# Patient Record
Sex: Female | Born: 1975
Health system: Southern US, Community
[De-identification: ages and names within clinical notes are randomized; demographics above are authoritative.]

## PROBLEM LIST (undated history)

## (undated) DIAGNOSIS — I493 Ventricular premature depolarization: Secondary | ICD-10-CM

## (undated) DIAGNOSIS — R569 Unspecified convulsions: Secondary | ICD-10-CM

## (undated) DIAGNOSIS — F419 Anxiety disorder, unspecified: Secondary | ICD-10-CM

## (undated) DIAGNOSIS — K219 Gastro-esophageal reflux disease without esophagitis: Secondary | ICD-10-CM

## (undated) HISTORY — DX: Ventricular premature depolarization: I49.3

## (undated) HISTORY — PX: OTHER SURGICAL HISTORY: SHX169

---

## 1999-12-05 ENCOUNTER — Other Ambulatory Visit: Admission: RE | Admit: 1999-12-05 | Discharge: 1999-12-05 | Payer: Self-pay | Admitting: *Deleted

## 2001-01-20 ENCOUNTER — Other Ambulatory Visit: Admission: RE | Admit: 2001-01-20 | Discharge: 2001-01-20 | Payer: Self-pay | Admitting: *Deleted

## 2002-01-22 ENCOUNTER — Other Ambulatory Visit: Admission: RE | Admit: 2002-01-22 | Discharge: 2002-01-22 | Payer: Self-pay | Admitting: *Deleted

## 2003-02-01 ENCOUNTER — Other Ambulatory Visit: Admission: RE | Admit: 2003-02-01 | Discharge: 2003-02-01 | Payer: Self-pay | Admitting: *Deleted

## 2014-01-15 ENCOUNTER — Encounter: Payer: Self-pay | Admitting: Emergency Medicine

## 2014-01-15 ENCOUNTER — Emergency Department
Admission: EM | Admit: 2014-01-15 | Discharge: 2014-01-15 | Disposition: A | Discharge status: Home / Self Care | Payer: 59 | Source: Home / Self Care | Attending: Emergency Medicine | Admitting: Emergency Medicine

## 2014-01-15 DIAGNOSIS — I10 Essential (primary) hypertension: Secondary | ICD-10-CM

## 2014-01-15 DIAGNOSIS — Z0130 Encounter for examination of blood pressure without abnormal findings: Secondary | ICD-10-CM

## 2014-01-15 HISTORY — DX: Unspecified convulsions: R56.9

## 2014-01-15 HISTORY — DX: Gastro-esophageal reflux disease without esophagitis: K21.9

## 2014-01-15 NOTE — ED Notes (Signed)
Received an electrical shock in R arm from touching a prong on a dryer cord.  Has rods in R forearm.  Went to ER and BP on adm. 130/90. And later 140/91. States her BP is normally low.  Today went to [employer] and checked her BP and it was 126/93.

## 2014-01-15 NOTE — ED Provider Notes (Signed)
CSN: [id number]     Arrival date & time 01/15/14  1537 History   First MD Initiated Contact with Patient 01/15/14 1552     Chief Complaint  Patient presents with  . Hypertension   (Consider location/radiation/quality/duration/timing/severity/associated sxs/prior Treatment) Patient is a 38 y.o. female presenting with hypertension. The history is provided by the patient. No language interpreter was used.  Hypertension This is a new problem. The current episode started more than 2 days ago. The problem occurs constantly. The problem has been gradually worsening. Pertinent negatives include no chest pain. Nothing aggravates the symptoms. Nothing relieves the symptoms. She has tried nothing for the symptoms.  Pt had a shock for hair dryer cord.   Pt was noted to have increased BP.   Pt advised to have rechecked  Past Medical History  Diagnosis Date  . Seizures   . GERD (gastroesophageal reflux disease)    Past Surgical History  Procedure Laterality Date  . Right arm    . Right foot     Family History  Problem Relation Age of Onset  . Cancer Father   . COPD Father    History  Substance Use Topics  . Smoking status: Never Smoker   . Smokeless tobacco: Never Used  . Alcohol Use: Yes   OB History   Grav Para Term Preterm Abortions TAB SAB Ect Mult Living                 Review of Systems  Cardiovascular: Negative for chest pain.  All other systems reviewed and are negative.   Allergies  Codeine  Home Medications   Prior to Admission medications   Medication Sig Start Date End Date Taking? Authorizing Provider  lamoTRIgine (LAMICTAL) 100 MG tablet Take 100 mg by mouth daily.   Yes Historical Provider, MD  norethindrone-ethinyl estradiol (JUNEL FE,GILDESS FE,LOESTRIN FE) 1-20 MG-MCG tablet Take 1 tablet by mouth daily.   Yes Historical Provider, MD  pantoprazole (PROTONIX) 20 MG tablet Take 20 mg by mouth daily.   Yes Historical Provider, MD  ranitidine (ZANTAC) 300 MG  tablet Take 300 mg by mouth at bedtime.   Yes Historical Provider, MD   BP 137/87  Pulse 98  Temp(Src) 98.7 F (37.1 C) (Oral)  Ht  (1.676 m)  Wt 233 lb (105.688 kg)  BMI 37.63 kg/m2  SpO2 98% Physical Exam  Nursing note and vitals reviewed. Constitutional: She is oriented to person, place, and time. She appears well-developed and well-nourished.  HENT:  Head: Normocephalic and atraumatic.  Eyes: EOM are normal. Pupils are equal, round, and reactive to light.  Neck: Normal range of motion.  Cardiovascular: Normal rate and normal heart sounds.   Pulmonary/Chest: Effort normal and breath sounds normal.  Abdominal: Soft. She exhibits no distension.  Musculoskeletal: Normal range of motion.  Neurological: She is alert and oriented to person, place, and time.  Skin: Skin is warm.  Psychiatric: She has a normal mood and affect.    ED Course  Procedures (including critical care time) Labs Review Labs Reviewed - No data to display  Imaging Review No results found.   MDM   1. Blood pressure check    Reduce sodium Diet Exercise Monitor blood pressure biweekly and keep documentation for primary Md    Elson Areas, [place] 01/15/14 1732

## 2014-01-15 NOTE — Discharge Instructions (Signed)

## 2014-03-26 ENCOUNTER — Encounter: Payer: Self-pay | Admitting: Emergency Medicine

## 2014-03-26 ENCOUNTER — Emergency Department
Admission: EM | Admit: 2014-03-26 | Discharge: 2014-03-26 | Disposition: A | Discharge status: Home / Self Care | Payer: 59 | Source: Home / Self Care | Attending: Family Medicine | Admitting: Family Medicine

## 2014-03-26 DIAGNOSIS — B9789 Other viral agents as the cause of diseases classified elsewhere: Principal | ICD-10-CM

## 2014-03-26 DIAGNOSIS — J069 Acute upper respiratory infection, unspecified: Secondary | ICD-10-CM

## 2014-03-26 HISTORY — DX: Anxiety disorder, unspecified: F41.9

## 2014-03-26 MED ORDER — BENZONATATE 200 MG PO CAPS: 200.0000 mg | ORAL_CAPSULE | Freq: Every day | ORAL | Status: DC

## 2014-03-26 MED ORDER — AZITHROMYCIN 250 MG PO TABS: ORAL_TABLET | ORAL | Status: DC

## 2014-03-26 NOTE — ED Notes (Signed)
Reports increasing congestion and pain across sinuses over past 2 days; headache. No OTCs this morning.

## 2014-03-26 NOTE — ED Provider Notes (Signed)
CSN: [id number]     Arrival date & time 03/26/14  [id number] History   First MD Initiated Contact with Patient 03/26/14 [phone]     Chief Complaint  Patient presents with  . Facial Pain      HPI Comments: Two days ago patient developed fatigue, hoarseness, headache, sinus congestion and facial pain.  Yesterday she developed a productive cough.  No sore throat or fever.  She has started taking Zyrtec without improvement.  The history is provided by the patient.    Past Medical History  Diagnosis Date  . Seizures   . GERD (gastroesophageal reflux disease)   . Anxiety    Past Surgical History  Procedure Laterality Date  . Right arm    . Right foot     Family History  Problem Relation Age of Onset  . Cancer Father   . COPD Father    History  Substance Use Topics  . Smoking status: Never Smoker   . Smokeless tobacco: Never Used  . Alcohol Use: Yes   OB History    No data available     Review of Systems No sore throat + cough No pleuritic pain No wheezing + nasal congestion ? post-nasal drainage No sinus pain/pressure No itchy/red eyes No earache No hemoptysis No SOB No fever/chills No nausea No vomiting No abdominal pain No diarrhea No urinary symptoms No skin rash + fatigue No myalgias + headache Used OTC meds without relief  Allergies  Codeine  Home Medications   Prior to Admission medications   Medication Sig Start Date End Date Taking? Authorizing Provider  azithromycin (ZITHROMAX Z-PAK) 250 MG tablet Take 2 tabs today; then begin one tab once daily for 4 more days. (Rx void after 04/03/14) 03/26/14   Lattie HawStephen A Mechille Varghese, MD  benzonatate (TESSALON) 200 MG capsule Take 1 capsule (200 mg total) by mouth at bedtime. Take as needed for cough 03/26/14   Lattie HawStephen A Mersades Barbaro, MD  lamoTRIgine (LAMICTAL) 100 MG tablet Take 100 mg by mouth daily.    Historical Provider, MD  norethindrone-ethinyl estradiol (JUNEL FE,GILDESS FE,LOESTRIN FE) 1-20 MG-MCG tablet Take 1 tablet  by mouth daily.    Historical Provider, MD  pantoprazole (PROTONIX) 20 MG tablet Take 20 mg by mouth daily.    Historical Provider, MD  ranitidine (ZANTAC) 300 MG tablet Take 300 mg by mouth at bedtime.    Historical Provider, MD   BP 119/74 mmHg  Pulse 104  Temp(Src) 98.3 F (36.8 C) (Oral)  Resp 16  Ht 5\' 6"  (1.676 m)  Wt 234 lb (106.142 kg)  BMI 37.79 kg/m2  SpO2 98%  LMP 03/19/2014 Physical Exam Nursing notes and Vital Signs reviewed. Appearance:  Patient appears stated age, and in no acute distress.  Patient is obese (BMI 37.8) Eyes:  Pupils are equal, round, and reactive to light and accomodation.  Extraocular movement is intact.  Conjunctivae are not inflamed  Ears:  Canals normal.  Tympanic membranes normal.  Nose:  Mildly congested turbinates.  No sinus tenderness.   Pharynx:  Normal Neck:  Supple.  No adenopathy Lungs:  Clear to auscultation.  Breath sounds are equal.  Heart:  Regular rate and rhythm without murmurs, rubs, or gallops.  Abdomen:  Nontender without masses or hepatosplenomegaly.  Bowel sounds are present.  No CVA or flank tenderness.  Extremities:  No edema.  No calf tenderness Skin:  No rash present.   ED Course  Procedures  none   MDM   1. Viral URI  with cough    There is no evidence of bacterial infection today.   Treat symptomatically for now  Prescription written for Benzonatate (Tessalon) to take at bedtime for night-time cough.  Take plain Mucinex (1200 mg guaifenesin) twice daily for cough and congestion.  Increase fluid intake, rest. May use Afrin nasal spray (or generic oxymetazoline) twice daily for about 5 days.  Also recommend using saline nasal spray several times daily and saline nasal irrigation (AYR is a common brand) Try warm salt water gargles for sore throat.  Stop all antihistamines for now, and other non-prescription cough/cold preparations. Begin Azithromycin if not improving about one week or if persistent fever develops (Given  a prescription to hold, with an expiration date)  Follow-up with family doctor if not improving about10 days.     Lattie HawStephen A Tyshun Tuckerman, MD 03/29/14 [phone]

## 2014-03-26 NOTE — Discharge Instructions (Signed)
Take plain Mucinex (1200 mg guaifenesin) twice daily for cough and congestion.  Increase fluid intake, rest. °May use Afrin nasal spray (or generic oxymetazoline) twice daily for about 5 days.  Also recommend using saline nasal spray several times daily and saline nasal irrigation (AYR is a common brand) °Try warm salt water gargles for sore throat.  °Stop all antihistamines for now, and other non-prescription cough/cold preparations. °Begin Azithromycin if not improving about one week or if persistent fever develops   °Follow-up with family doctor if not improving about10 days.  °

## 2014-04-10 ENCOUNTER — Emergency Department (INDEPENDENT_AMBULATORY_CARE_PROVIDER_SITE_OTHER): Payer: 59

## 2014-04-10 ENCOUNTER — Emergency Department
Admission: EM | Admit: 2014-04-10 | Discharge: 2014-04-10 | Disposition: A | Discharge status: Home / Self Care | Payer: 59 | Source: Home / Self Care | Attending: Family Medicine | Admitting: Family Medicine

## 2014-04-10 DIAGNOSIS — R229 Localized swelling, mass and lump, unspecified: Secondary | ICD-10-CM

## 2014-04-10 DIAGNOSIS — R52 Pain, unspecified: Secondary | ICD-10-CM

## 2014-04-10 DIAGNOSIS — S93402A Sprain of unspecified ligament of left ankle, initial encounter: Secondary | ICD-10-CM

## 2014-04-10 MED ORDER — DICLOFENAC SODIUM 50 MG PO TBEC: 50.0000 mg | DELAYED_RELEASE_TABLET | Freq: Two times a day (BID) | ORAL | Status: DC | PRN

## 2014-04-10 NOTE — ED Provider Notes (Addendum)
Tabitha Hill is a 38 y.o. female who presents to Urgent Care today for left ankle injury. Patient fell onto a hole yesterday afternoon suffering an inversion injury to her ankle. She notes pain and swelling. She has not tried any medications. Pain is worse with activity better with rest. She is having difficulty walking.  No chest pains or palpitations.   Past Medical History  Diagnosis Date  . Seizures   . GERD (gastroesophageal reflux disease)   . Anxiety    Past Surgical History  Procedure Laterality Date  . Right arm    . Right foot     History  Substance Use Topics  . Smoking status: Never Smoker   . Smokeless tobacco: Never Used  . Alcohol Use: Yes   ROS as above Medications: No current facility-administered medications for this encounter.   Current Outpatient Prescriptions  Medication Sig Dispense Refill  . diclofenac (VOLTAREN) 50 MG EC tablet Take 1 tablet (50 mg total) by mouth 2 (two) times daily as needed. 60 tablet 0  . lamoTRIgine (LAMICTAL) 100 MG tablet Take 100 mg by mouth daily.    . norethindrone-ethinyl estradiol (JUNEL FE,GILDESS FE,LOESTRIN FE) 1-20 MG-MCG tablet Take 1 tablet by mouth daily.    . ranitidine (ZANTAC) 300 MG tablet Take 300 mg by mouth at bedtime.    . [DISCONTINUED] pantoprazole (PROTONIX) 20 MG tablet Take 20 mg by mouth daily.     Allergies  Allergen Reactions  . Codeine      Exam:  BP 115/80 mmHg  Pulse 134  Temp(Src) 98.1 F (36.7 C) (Oral)  Wt 230 lb 8 oz (104.554 kg)  SpO2 97%  LMP 03/19/2014 Gen: Well NAD Left ankle: Swollen lateral and medially. Tender. Stable ligaments exam. Motion diminished. Pulses capillary refill sensation intact distally.  No results found for this or any previous visit (from the past 24 hour(s)). Dg Ankle Complete Left  04/10/2014   CLINICAL DATA:  Left ankle pain post stepping in a hole yesterday  EXAM: LEFT ANKLE COMPLETE - 3+ VIEW  COMPARISON:  None.  FINDINGS: Three views of left ankle  submitted. No acute fracture or subluxation. There is soft tissue swelling adjacent to lateral malleolus. Ankle mortise is preserved.  IMPRESSION: No acute fracture or subluxation.  Lateral soft tissue swelling.   Electronically Signed   By: Natasha MeadLiviu  Pop M.D.   On: 04/10/2014 12:26    Assessment and Plan: 38 y.o. female with left ankle sprain. ASO crutches NSAIDs physical therapy follow up with sports medicine. Home exercise program as well.  She additionally has an elevated heart rate. This is due to pain. Follow-up with PCP.  Discussed warning signs or symptoms. Please see discharge instructions. Patient expresses understanding.     Rodolph BongEvan S Corey, MD 04/10/14 1254  Rodolph BongEvan S Corey, MD 04/10/14 1254

## 2014-04-10 NOTE — ED Notes (Signed)
Fell in hole yesterday around 4pm, heard loud pop.  Ankle swollen and increase pain when pressure is applied

## 2014-04-10 NOTE — Discharge Instructions (Signed)
Thank you for coming in today. Attend physical therapy and follow-up with Dr. Karie Schwalbe Take diclofenac twice daily for pain. Follow-up with your primary care doctor regarding your elevated heart rate.  Acute Ankle Sprain with Phase I Rehab An acute ankle sprain is a partial or complete tear in one or more of the ligaments of the ankle due to traumatic injury. The severity of the injury depends on both the number of ligaments sprained and the grade of sprain. There are 3 grades of sprains.   A grade 1 sprain is a mild sprain. There is a slight pull without obvious tearing. There is no loss of strength, and the muscle and ligament are the correct length.  A grade 2 sprain is a moderate sprain. There is tearing of fibers within the substance of the ligament where it connects two bones or two cartilages. The length of the ligament is increased, and there is usually decreased strength.  A grade 3 sprain is a complete rupture of the ligament and is uncommon. In addition to the grade of sprain, there are three types of ankle sprains.  Lateral ankle sprains: This is a sprain of one or more of the three ligaments on the outer side (lateral) of the ankle. These are the most common sprains. Medial ankle sprains: There is one large triangular ligament of the inner side (medial) of the ankle that is susceptible to injury. Medial ankle sprains are less common. Syndesmosis, "high ankle," sprains: The syndesmosis is the ligament that connects the two bones of the lower leg. Syndesmosis sprains usually only occur with very severe ankle sprains. SYMPTOMS  Pain, tenderness, and swelling in the ankle, starting at the side of injury that may progress to the whole ankle and foot with time.  "Pop" or tearing sensation at the time of injury.  Bruising that may spread to the heel.  Impaired ability to walk soon after injury. CAUSES   Acute ankle sprains are caused by trauma placed on the ankle that temporarily forces  or pries the anklebone (talus) out of its normal socket.  Stretching or tearing of the ligaments that normally hold the joint in place (usually due to a twisting injury). RISK INCREASES WITH:  Previous ankle sprain.  Sports in which the foot may land awkwardly (i.e., basketball, volleyball, or soccer) or walking or running on uneven or rough surfaces.  Shoes with inadequate support to prevent sideways motion when stress occurs.  Poor strength and flexibility.  Poor balance skills.  Contact sports. PREVENTION   Warm up and stretch properly before activity.  Maintain physical fitness:  Ankle and leg flexibility, muscle strength, and endurance.  Cardiovascular fitness.  Balance training activities.  Use proper technique and have a coach correct improper technique.  Taping, protective strapping, bracing, or high-top tennis shoes may help prevent injury. Initially, tape is best; however, it loses most of its support function within 10 to 15 minutes.  Wear proper-fitted protective shoes (High-top shoes with taping or bracing is more effective than either alone).  Provide the ankle with support during sports and practice activities for 12 months following injury. PROGNOSIS   If treated properly, ankle sprains can be expected to recover completely; however, the length of recovery depends on the degree of injury.  A grade 1 sprain usually heals enough in 5 to 7 days to allow modified activity and requires an average of 6 weeks to heal completely.  A grade 2 sprain requires 6 to 10 weeks to heal completely.  A grade 3 sprain requires 12 to 16 weeks to heal.  A syndesmosis sprain often takes more than 3 months to heal. RELATED COMPLICATIONS   Frequent recurrence of symptoms may result in a chronic problem. Appropriately addressing the problem the first time decreases the frequency of recurrence and optimizes healing time. Severity of the initial sprain does not predict the  likelihood of later instability.  Injury to other structures (bone, cartilage, or tendon).  A chronically unstable or arthritic ankle joint is a possibility with repeated sprains. TREATMENT Treatment initially involves the use of ice, medication, and compression bandages to help reduce pain and inflammation. Ankle sprains are usually immobilized in a walking cast or boot to allow for healing. Crutches may be recommended to reduce pressure on the injury. After immobilization, strengthening and stretching exercises may be necessary to regain strength and a full range of motion. Surgery is rarely needed to treat ankle sprains. MEDICATION   Nonsteroidal anti-inflammatory medications, such as aspirin and ibuprofen (do not take for the first 3 days after injury or within 7 days before surgery), or other minor pain relievers, such as acetaminophen, are often recommended. Take these as directed by your caregiver. Contact your caregiver immediately if any bleeding, stomach upset, or signs of an allergic reaction occur from these medications.  Ointments applied to the skin may be helpful.  Pain relievers may be prescribed as necessary by your caregiver. Do not take prescription pain medication for longer than 4 to 7 days. Use only as directed and only as much as you need. HEAT AND COLD  Cold treatment (icing) is used to relieve pain and reduce inflammation for acute and chronic cases. Cold should be applied for 10 to 15 minutes every 2 to 3 hours for inflammation and pain and immediately after any activity that aggravates your symptoms. Use ice packs or an ice massage.  Heat treatment may be used before performing stretching and strengthening activities prescribed by your caregiver. Use a heat pack or a warm soak. SEEK IMMEDIATE MEDICAL CARE IF:   Pain, swelling, or bruising worsens despite treatment.  You experience pain, numbness, discoloration, or coldness in the foot or toes.  New, unexplained  symptoms develop (drugs used in treatment may produce side effects.) EXERCISES  PHASE I EXERCISES RANGE OF MOTION (ROM) AND STRETCHING EXERCISES - Ankle Sprain, Acute Phase I, Weeks 1 to 2 These exercises may help you when beginning to restore flexibility in your ankle. You will likely work on these exercises for the 1 to 2 weeks after your injury. Once your physician, physical therapist, or athletic trainer sees adequate progress, he or she will advance your exercises. While completing these exercises, remember:   Restoring tissue flexibility helps normal motion to return to the joints. This allows healthier, less painful movement and activity.  An effective stretch should be held for at least 30 seconds.  A stretch should never be painful. You should only feel a gentle lengthening or release in the stretched tissue. RANGE OF MOTION - Dorsi/Plantar Flexion  While sitting with your right / left knee straight, draw the top of your foot upwards by flexing your ankle. Then reverse the motion, pointing your toes downward.  Hold each position for __________ seconds.  After completing your first set of exercises, repeat this exercise with your knee bent. Repeat __________ times. Complete this exercise __________ times per day.  RANGE OF MOTION - Ankle Alphabet  Imagine your right / left big toe is a pen.  Keeping  your hip and knee still, write out the entire alphabet with your "pen." Make the letters as large as you can without increasing any discomfort. Repeat __________ times. Complete this exercise __________ times per day.  STRENGTHENING EXERCISES - Ankle Sprain, Acute -Phase I, Weeks 1 to 2 These exercises may help you when beginning to restore strength in your ankle. You will likely work on these exercises for 1 to 2 weeks after your injury. Once your physician, physical therapist, or athletic trainer sees adequate progress, he or she will advance your exercises. While completing these  exercises, remember:   Muscles can gain both the endurance and the strength needed for everyday activities through controlled exercises.  Complete these exercises as instructed by your physician, physical therapist, or athletic trainer. Progress the resistance and repetitions only as guided.  You may experience muscle soreness or fatigue, but the pain or discomfort you are trying to eliminate should never worsen during these exercises. If this pain does worsen, stop and make certain you are following the directions exactly. If the pain is still present after adjustments, discontinue the exercise until you can discuss the trouble with your clinician. STRENGTH - Dorsiflexors  Secure a rubber exercise band/tubing to a fixed object (i.e., table, pole) and loop the other end around your right / left foot.  Sit on the floor facing the fixed object. The band/tubing should be slightly tense when your foot is relaxed.  Slowly draw your foot back toward you using your ankle and toes.  Hold this position for __________ seconds. Slowly release the tension in the band and return your foot to the starting position. Repeat __________ times. Complete this exercise __________ times per day.  STRENGTH - Plantar-flexors   Sit with your right / left leg extended. Holding onto both ends of a rubber exercise band/tubing, loop it around the ball of your foot. Keep a slight tension in the band.  Slowly push your toes away from you, pointing them downward.  Hold this position for __________ seconds. Return slowly, controlling the tension in the band/tubing. Repeat __________ times. Complete this exercise __________ times per day.  STRENGTH - Ankle Eversion  Secure one end of a rubber exercise band/tubing to a fixed object (table, pole). Loop the other end around your foot just before your toes.  Place your fists between your knees. This will focus your strengthening at your ankle.  Drawing the band/tubing  across your opposite foot, slowly, pull your little toe out and up. Make sure the band/tubing is positioned to resist the entire motion.  Hold this position for __________ seconds. Have your muscles resist the band/tubing as it slowly pulls your foot back to the starting position.  Repeat __________ times. Complete this exercise __________ times per day.  STRENGTH - Ankle Inversion  Secure one end of a rubber exercise band/tubing to a fixed object (table, pole). Loop the other end around your foot just before your toes.  Place your fists between your knees. This will focus your strengthening at your ankle.  Slowly, pull your big toe up and in, making sure the band/tubing is positioned to resist the entire motion.  Hold this position for __________ seconds.  Have your muscles resist the band/tubing as it slowly pulls your foot back to the starting position. Repeat __________ times. Complete this exercises __________ times per day.  STRENGTH - Towel Curls  Sit in a chair positioned on a non-carpeted surface.  Place your right / left foot on a towel,  keeping your heel on the floor.  Pull the towel toward your heel by only curling your toes. Keep your heel on the floor.  If instructed by your physician, physical therapist, or athletic trainer, add weight to the end of the towel. Repeat __________ times. Complete this exercise __________ times per day. Document Released: 11/28/2004 Document Revised: 09/13/2013 Document Reviewed: 08/11/2008 [facility] Patient Information 2015 [employer], [place]. This information is not intended to replace advice given to you by your health care provider. Make sure you discuss any questions you have with your health care provider.

## 2014-04-15 ENCOUNTER — Encounter: Payer: Self-pay | Admitting: Sports Medicine

## 2014-04-15 ENCOUNTER — Ambulatory Visit (INDEPENDENT_AMBULATORY_CARE_PROVIDER_SITE_OTHER): Payer: 59 | Admitting: Sports Medicine

## 2014-04-15 VITALS — BP 135/89 | HR 109 | Ht 66.0 in | Wt 232.0 lb

## 2014-04-15 DIAGNOSIS — S93409A Sprain of unspecified ligament of unspecified ankle, initial encounter: Secondary | ICD-10-CM | POA: Insufficient documentation

## 2014-04-15 DIAGNOSIS — S99912A Unspecified injury of left ankle, initial encounter: Secondary | ICD-10-CM

## 2014-04-15 NOTE — Progress Notes (Signed)
   Subjective:    I'm seeing this patient as a consultation for:  Dr. Clementeen GrahamEvan Corey  CC:  Left ankle injury  HPI: This is a very pleasant 38 year old female, proximally one week ago she fell and had an inversion injury of her left ankle, she had immediate swelling, pain, bruising. She was seen in urgent care where x-rays were negative for fracture or talar dome injury. She was placed in an ASO and was referred to me for further evaluation and definitive treatment. Pain is predominantly along the medial ankle and deltoid ligament complex, she does tell if she gets an occasional catch, lock, and pop, the pop results in improvement in her pain.  Past medical history, Surgical history, Family history not pertinant except as noted below, Social history, Allergies, and medications have been entered into the medical record, reviewed, and no changes needed.   Review of Systems: No headache, visual changes, nausea, vomiting, diarrhea, constipation, dizziness, abdominal pain, skin rash, fevers, chills, night sweats, weight loss, swollen lymph nodes, body aches, joint swelling, muscle aches, chest pain, shortness of breath, mood changes, visual or auditory hallucinations.   Objective:   General: Well Developed, well nourished, and in no acute distress.  Neuro/Psych: Alert and oriented x3, extra-ocular muscles intact, able to move all 4 extremities, sensation grossly intact. Skin: Warm and dry, no rashes noted.  Respiratory: Not using accessory muscles, speaking in full sentences, trachea midline.  Cardiovascular: Pulses palpable, no extremity edema. Abdomen: Does not appear distended. Left Ankle: Visibly swollen and bruised with a palpable fluid wave at the talocrural joint. Range of motion is full in all directions. Application of varus stress/forced inversion of the ankle results in significant pain localized to the medial talocrural joint. Strength is 5/5 in all directions. Stable lateral and medial  ligaments; squeeze test,  Talar dome nontender; No pain at base of 5th MT; No tenderness over cuboid; No tenderness over N spot or navicular prominence No tenderness on posterior aspects of lateral and medial malleolus No sign of peroneal tendon subluxations; Negative tarsal tunnel tinel's Able to walk 4 steps.  Strap with compressive dressing, CAM boot applied.  Impression and Recommendations:   This case required medical decision making of moderate complexity.

## 2014-04-15 NOTE — Assessment & Plan Note (Signed)
Symptoms are suggestive of the deltoid ligament complex sprain. Considering her mechanical symptoms and moderate to severe pain at the talocrural joint I'm also worried about intra-articular loose body. Strap with compressive dressing, cam boot for a month, if no better we will obtain an MRI.

## 2014-05-16 ENCOUNTER — Encounter: Payer: Self-pay | Admitting: Sports Medicine

## 2014-05-16 ENCOUNTER — Ambulatory Visit (INDEPENDENT_AMBULATORY_CARE_PROVIDER_SITE_OTHER): Payer: 59 | Admitting: Sports Medicine

## 2014-05-16 VITALS — BP 135/89 | HR 102 | Ht 66.0 in | Wt 229.0 lb

## 2014-05-16 DIAGNOSIS — S99912D Unspecified injury of left ankle, subsequent encounter: Secondary | ICD-10-CM

## 2014-05-16 NOTE — Progress Notes (Signed)
  Subjective:    CC: recheck left ankle  HPI: One month ago this pleasant 39 year old female had a fairly severe deltoid ligament complex sprain. She also had some mechanical symptoms. She has been in a boot for the past month and returns essentially pain-free, only mild residual pain at the anterior talocrural joint, no further mechanical symptoms.  Past medical history, Surgical history, Family history not pertinant except as noted below, Social history, Allergies, and medications have been entered into the medical record, reviewed, and no changes needed.   Review of Systems: No fevers, chills, night sweats, weight loss, chest pain, or shortness of breath.   Objective:    General: Well Developed, well nourished, and in no acute distress.  Neuro: Alert and oriented x3, extra-ocular muscles intact, sensation grossly intact.  HEENT: Normocephalic, atraumatic, pupils equal round reactive to light, neck supple, no masses, no lymphadenopathy, thyroid nonpalpable.  Skin: Warm and dry, no rashes. Cardiac: Regular rate and rhythm, no murmurs rubs or gallops, no lower extremity edema.  Respiratory: Clear to auscultation bilaterally. Not using accessory muscles, speaking in full sentences. Left Ankle: No visible erythema or swelling. Range of motion is full in all directions. Strength is 5/5 in all directions. Stable lateral and medial ligaments; squeeze test and kleiger test unremarkable; Talar dome nontender;only minimally tender over the anterior talocrural joint. No pain at base of 5th MT; No tenderness over cuboid; No tenderness over N spot or navicular prominence No tenderness on posterior aspects of lateral and medial malleolus No sign of peroneal tendon subluxations; Negative tarsal tunnel tinel's Able to walk 4 steps.  Impression and Recommendations:

## 2014-05-16 NOTE — Assessment & Plan Note (Signed)
Doing much better. She does have some pain referable to the talocrural joint. Swelling has resolved and ankle is stable. I would like her to do some home rehabilitation exercises for the next month, continue regular shoe and weight-bearing as tolerated. If no better in one month we should certainly consider MRI.

## 2014-06-16 ENCOUNTER — Ambulatory Visit: Payer: 59 | Admitting: Sports Medicine

## 2014-06-16 DIAGNOSIS — Z0289 Encounter for other administrative examinations: Secondary | ICD-10-CM

## 2014-10-14 ENCOUNTER — Emergency Department
Admission: EM | Admit: 2014-10-14 | Discharge: 2014-10-14 | Disposition: A | Discharge status: Home / Self Care | Payer: 59 | Source: Home / Self Care | Attending: Emergency Medicine | Admitting: Emergency Medicine

## 2014-10-14 ENCOUNTER — Encounter: Payer: Self-pay | Admitting: Emergency Medicine

## 2014-10-14 DIAGNOSIS — J029 Acute pharyngitis, unspecified: Secondary | ICD-10-CM | POA: Diagnosis not present

## 2014-10-14 MED ORDER — AMOXICILLIN 500 MG PO CAPS: 500.0000 mg | ORAL_CAPSULE | Freq: Three times a day (TID) | ORAL | Status: DC

## 2014-10-14 NOTE — Discharge Instructions (Signed)

## 2014-10-14 NOTE — ED Notes (Signed)
Reports onset of sore throat 2 days ago; yesterday body aches and fever.

## 2014-10-14 NOTE — ED Provider Notes (Signed)
CSN: [id number]     Arrival date & time 10/14/14  [id number] History   First MD Initiated Contact with Patient 10/14/14 0840     Chief Complaint  Patient presents with  . Generalized Body Aches  . Sore Throat   (Consider location/radiation/quality/duration/timing/severity/associated sxs/prior Treatment) Patient is a 39 y.o. female presenting with pharyngitis. The history is provided by the patient. No language interpreter was used.  Sore Throat This is a new problem. The current episode started 2 days ago. The problem occurs constantly. The problem has been gradually worsening. The symptoms are aggravated by drinking. Nothing relieves the symptoms. The treatment provided no relief.    Past Medical History  Diagnosis Date  . Seizures   . GERD (gastroesophageal reflux disease)   . Anxiety    Past Surgical History  Procedure Laterality Date  . Right arm    . Right foot     Family History  Problem Relation Age of Onset  . Cancer Father   . COPD Father    History  Substance Use Topics  . Smoking status: Never Smoker   . Smokeless tobacco: Never Used  . Alcohol Use: Yes   OB History    No data available     Review of Systems  HENT: Positive for sore throat.   All other systems reviewed and are negative.   Allergies  Codeine  Home Medications   Prior to Admission medications   Medication Sig Start Date End Date Taking? Authorizing Provider  amoxicillin (AMOXIL) 500 MG capsule Take 1 capsule (500 mg total) by mouth 3 (three) times daily. 10/14/14   Elson AreasLeslie K Sofia, PA-C  diclofenac (VOLTAREN) 50 MG EC tablet Take 1 tablet (50 mg total) by mouth 2 (two) times daily as needed. 04/10/14   Rodolph BongEvan S Corey, MD  lamoTRIgine (LAMICTAL) 100 MG tablet Take 100 mg by mouth daily.    Historical Provider, MD  norethindrone-ethinyl estradiol (JUNEL FE,GILDESS FE,LOESTRIN FE) 1-20 MG-MCG tablet Take 1 tablet by mouth daily.    Historical Provider, MD  ranitidine (ZANTAC) 300 MG tablet Take 300  mg by mouth at bedtime.    Historical Provider, MD   BP 113/75 mmHg  Pulse 106  Temp(Src) 98.2 F (36.8 C) (Oral)  Resp 16  Ht 5\' 6"  (1.676 m)  Wt 236 lb (107.049 kg)  BMI 38.11 kg/m2  SpO2 96% Physical Exam  Constitutional: She is oriented to person, place, and time. She appears well-developed and well-nourished.  HENT:  Head: Normocephalic and atraumatic.  Right Ear: External ear normal.  Erythema thorat, enlarged tonsils  Eyes: EOM are normal. Pupils are equal, round, and reactive to light.  Neck: Normal range of motion.  Cardiovascular: Normal rate.   Pulmonary/Chest: Effort normal.  Abdominal: She exhibits no distension.  Musculoskeletal: Normal range of motion.  Neurological: She is alert and oriented to person, place, and time.  Skin: Skin is warm.  Psychiatric: She has a normal mood and affect.  Nursing note and vitals reviewed.   ED Course  Procedures (including critical care time) Labs Review Labs Reviewed - No data to display  Imaging Review No results found.   MDM   1. Acute pharyngitis, unspecified pharyngitis type    amoxicillian AVS Return if any problems.    Elson AreasLeslie K Sofia, PA-C 10/14/14 0842  Elson AreasLeslie K Sofia, PA-C 10/14/14 [phone]

## 2016-05-25 ENCOUNTER — Encounter: Payer: Self-pay | Admitting: Emergency Medicine

## 2016-05-25 ENCOUNTER — Emergency Department
Admission: EM | Admit: 2016-05-25 | Discharge: 2016-05-25 | Disposition: A | Discharge status: Home / Self Care | Payer: BLUE CROSS/BLUE SHIELD | Source: Home / Self Care | Attending: Family Medicine | Admitting: Family Medicine

## 2016-05-25 DIAGNOSIS — J029 Acute pharyngitis, unspecified: Secondary | ICD-10-CM | POA: Diagnosis not present

## 2016-05-25 LAB — POCT RAPID STREP A (OFFICE): Rapid Strep A Screen: NEGATIVE

## 2016-05-25 MED ORDER — FLUTICASONE PROPIONATE 50 MCG/ACT NA SUSP: 2.0000 | Freq: Every day | NASAL | 0 refills | Status: DC

## 2016-05-25 MED ORDER — IBUPROFEN 600 MG PO TABS: 600.0000 mg | ORAL_TABLET | Freq: Four times a day (QID) | ORAL | 0 refills | Status: DC | PRN

## 2016-05-25 MED ORDER — BENZOCAINE-MENTHOL 15-10 MG MT LOZG: 1.0000 | LOZENGE | Freq: Four times a day (QID) | OROMUCOSAL | 0 refills | Status: DC | PRN

## 2016-05-25 NOTE — ED Triage Notes (Signed)
Pt c/o sore throat x 1 week. Denies fever 

## 2016-05-25 NOTE — ED Provider Notes (Signed)
CSN: [id number]     Arrival date & time 05/25/16  1248 History   First MD Initiated Contact with Patient 05/25/16 1317     Chief Complaint  Patient presents with  . Sore Throat   (Consider location/radiation/quality/duration/timing/severity/associated sxs/prior Treatment) HPI Tabitha Hill is a 41 y.o. female presenting to UC with c/o sore throat for about 1 week.  Pain is minimal at this time but worse when swallowing. Symptoms started with sinus congestion so she took her allergy medication, those symptoms resolved but throat pain has persisted. No known sick contacts. Denies fever, chills, n/v/d. Denies difficulty breathing or swallowing.    Past Medical History:  Diagnosis Date  . Anxiety   . GERD (gastroesophageal reflux disease)   . Seizures (HCC)    Past Surgical History:  Procedure Laterality Date  . Right Arm    . Right foot     Family History  Problem Relation Age of Onset  . Cancer Father   . COPD Father    Social History  Substance Use Topics  . Smoking status: Never Smoker  . Smokeless tobacco: Never Used  . Alcohol use Yes   OB History    No data available     Review of Systems  Constitutional: Negative for chills and fever.  HENT: Positive for congestion, postnasal drip, rhinorrhea and sore throat. Negative for ear pain, trouble swallowing and voice change.   Respiratory: Negative for cough and wheezing.   Gastrointestinal: Negative for diarrhea, nausea and vomiting.  Musculoskeletal: Negative for arthralgias and myalgias.  Neurological: Negative for dizziness, light-headedness and headaches.    Allergies  Codeine  Home Medications   Prior to Admission medications   Medication Sig Start Date End Date Taking? Authorizing Provider  amoxicillin (AMOXIL) 500 MG capsule Take 1 capsule (500 mg total) by mouth 3 (three) times daily. 10/14/14   Lonia SkinnerLeslie K Sofia, PA-C  Benzocaine-Menthol 15-10 MG LOZG Use as directed 1 lozenge in the mouth or throat 4  (four) times daily as needed. 05/25/16   Junius FinnerErin O'Malley, PA-C  diclofenac (VOLTAREN) 50 MG EC tablet Take 1 tablet (50 mg total) by mouth 2 (two) times daily as needed. 04/10/14   Rodolph BongEvan S Corey, MD  fluticasone (FLONASE) 50 MCG/ACT nasal spray Place 2 sprays into both nostrils daily. 05/25/16   Junius FinnerErin O'Malley, PA-C  ibuprofen (ADVIL,MOTRIN) 600 MG tablet Take 1 tablet (600 mg total) by mouth every 6 (six) hours as needed. 05/25/16   Junius FinnerErin O'Malley, PA-C  lamoTRIgine (LAMICTAL) 100 MG tablet Take 100 mg by mouth daily.    Historical Provider, MD  norethindrone-ethinyl estradiol (JUNEL FE,GILDESS FE,LOESTRIN FE) 1-20 MG-MCG tablet Take 1 tablet by mouth daily.    Historical Provider, MD  ranitidine (ZANTAC) 300 MG tablet Take 300 mg by mouth at bedtime.    Historical Provider, MD   Meds Ordered and Administered this Visit  Medications - No data to display  BP 129/82 (BP Location: Right Arm)   Pulse 100   Temp 98.1 F (36.7 C) (Oral)   Wt 240 lb (108.9 kg)   SpO2 96%   BMI 38.74 kg/m  No data found.   Physical Exam  Constitutional: She is oriented to person, place, and time. She appears well-developed and well-nourished. No distress.  HENT:  Head: Normocephalic and atraumatic.  Right Ear: Tympanic membrane normal.  Left Ear: Tympanic membrane normal.  Nose: Mucosal edema present.  Mouth/Throat: Uvula is midline and mucous membranes are normal. Posterior oropharyngeal erythema present. No oropharyngeal  exudate, posterior oropharyngeal edema or tonsillar abscesses.  Eyes: EOM are normal.  Neck: Normal range of motion. Neck supple.  Cardiovascular: Normal rate and regular rhythm.   Pulmonary/Chest: Effort normal and breath sounds normal. No stridor. No respiratory distress. She has no wheezes. She has no rales.  Musculoskeletal: Normal range of motion.  Lymphadenopathy:    She has no cervical adenopathy.  Neurological: She is alert and oriented to person, place, and time.  Skin: Skin is warm  and dry. She is not diaphoretic.  Psychiatric: She has a normal mood and affect. Her behavior is normal.  Nursing note and vitals reviewed.   Urgent Care Course   Clinical Course     Procedures (including critical care time)  Labs Review Labs Reviewed  STREP A DNA PROBE  POCT RAPID STREP A (OFFICE)    Imaging Review No results found.    MDM   1. Pharyngitis, unspecified etiology    Pt c/o sore throat for about 1 week, which was preceded by allergy symptoms that have since resolved.  Rapid strep: Negative Will send culture No evidence of tonsillar abscess.  Encouraged symptomatic treatment. Rx: Flonase, ibuprofen, and throat lozenges. F/u with PCP in 1 week if not improving.    Junius Finner, PA-C 05/25/16 1438

## 2016-05-26 ENCOUNTER — Telehealth: Payer: Self-pay | Admitting: Emergency Medicine

## 2016-05-26 LAB — STREP A DNA PROBE
GASP: DETECTED
GASP: DETECTED

## 2016-05-26 MED ORDER — AMOXICILLIN 500 MG PO CAPS: 500.0000 mg | ORAL_CAPSULE | Freq: Two times a day (BID) | ORAL | 0 refills | Status: DC

## 2016-05-26 NOTE — Telephone Encounter (Signed)
Pt informed of positive strep.  Per Denny PeonErin, antbs sent into pharmacy.  TMartin,CMA

## 2016-05-26 NOTE — Telephone Encounter (Signed)
Strep culture came back POSITIVE.  Amoxicillin has been sent to pharmacy.

## 2017-06-16 ENCOUNTER — Other Ambulatory Visit: Payer: Self-pay

## 2017-06-16 ENCOUNTER — Emergency Department (HOSPITAL_BASED_OUTPATIENT_CLINIC_OR_DEPARTMENT_OTHER)
Admission: EM | Admit: 2017-06-16 | Discharge: 2017-06-16 | Disposition: A | Discharge status: Home / Self Care | Payer: BLUE CROSS/BLUE SHIELD | Attending: Emergency Medicine | Admitting: Emergency Medicine

## 2017-06-16 ENCOUNTER — Encounter (HOSPITAL_BASED_OUTPATIENT_CLINIC_OR_DEPARTMENT_OTHER): Payer: Self-pay | Admitting: Emergency Medicine

## 2017-06-16 ENCOUNTER — Emergency Department (HOSPITAL_BASED_OUTPATIENT_CLINIC_OR_DEPARTMENT_OTHER): Payer: BLUE CROSS/BLUE SHIELD

## 2017-06-16 DIAGNOSIS — Y998 Other external cause status: Secondary | ICD-10-CM | POA: Diagnosis not present

## 2017-06-16 DIAGNOSIS — M79601 Pain in right arm: Secondary | ICD-10-CM | POA: Diagnosis not present

## 2017-06-16 DIAGNOSIS — Y9367 Activity, basketball: Secondary | ICD-10-CM | POA: Diagnosis not present

## 2017-06-16 DIAGNOSIS — S161XXA Strain of muscle, fascia and tendon at neck level, initial encounter: Secondary | ICD-10-CM | POA: Diagnosis not present

## 2017-06-16 DIAGNOSIS — Y92310 Basketball court as the place of occurrence of the external cause: Secondary | ICD-10-CM | POA: Insufficient documentation

## 2017-06-16 DIAGNOSIS — Z79899 Other long term (current) drug therapy: Secondary | ICD-10-CM | POA: Insufficient documentation

## 2017-06-16 DIAGNOSIS — X509XXA Other and unspecified overexertion or strenuous movements or postures, initial encounter: Secondary | ICD-10-CM | POA: Insufficient documentation

## 2017-06-16 DIAGNOSIS — S199XXA Unspecified injury of neck, initial encounter: Secondary | ICD-10-CM | POA: Diagnosis present

## 2017-06-16 LAB — PREGNANCY, URINE: Preg Test, Ur: NEGATIVE

## 2017-06-16 MED ORDER — IBUPROFEN 600 MG PO TABS: 600.0000 mg | ORAL_TABLET | Freq: Three times a day (TID) | ORAL | 0 refills | Status: DC | PRN

## 2017-06-16 MED ORDER — IBUPROFEN 400 MG PO TABS
600.0000 mg | ORAL_TABLET | Freq: Once | ORAL | Status: AC
  Administered 2017-06-16: 600 mg via ORAL
  Filled 2017-06-16: qty 1

## 2017-06-16 MED ORDER — CYCLOBENZAPRINE HCL 10 MG PO TABS: 10.0000 mg | ORAL_TABLET | Freq: Three times a day (TID) | ORAL | 0 refills | Status: DC | PRN

## 2017-06-16 MED ORDER — PREDNISONE 50 MG PO TABS
60.0000 mg | ORAL_TABLET | Freq: Once | ORAL | Status: AC
  Administered 2017-06-16: 60 mg via ORAL
  Filled 2017-06-16: qty 1

## 2017-06-16 MED ORDER — PREDNISONE 20 MG PO TABS: 40.0000 mg | ORAL_TABLET | Freq: Every day | ORAL | 0 refills | Status: AC

## 2017-06-16 MED FILL — predniSONE 20 MG TABS: 20 | 5 days supply | Qty: 10 | Fill #0

## 2017-06-16 MED FILL — CYCLOBENZAPRINE HCL 10 MG T: 10 | 4 days supply | Qty: 12 | Fill #0

## 2017-06-16 MED FILL — IBUPROFEN 600 MG TABLET: 600 | 5 days supply | Qty: 15 | Fill #0

## 2017-06-16 NOTE — ED Notes (Signed)
Patient transported to X-ray 

## 2017-06-16 NOTE — ED Provider Notes (Signed)
[facility] EMERGENCY DEPARTMENT Provider Note   CSN: [id number] Arrival date & time: 06/16/17  [id number]     History   Chief Complaint Chief Complaint  Patient presents with  . Neck Pain    shoulder pain    HPI Tabitha Hill is a 42 y.o. female.  HPI Patient is a 42 year old female presents the emergency department with neck pain after playing basketball with her son yesterday.  She threw the ball and had sudden pain in her neck.  She reports midline cervical neck pain with some radiation down her right arm.  She denies weakness of her arms or legs.  She felt a "crunching" sensation.  She denies lower extremity symptoms.  No chest pain or shortness of breath.  Pain is mild in severity.  No meds prior to arrival.    Past Medical History:  Diagnosis Date  . Anxiety   . GERD (gastroesophageal reflux disease)   . Seizures [facility](HCC)     Patient Active Problem List   Diagnosis Date Noted  . Left ankle injury 04/15/2014    Past Surgical History:  Procedure Laterality Date  . Right Arm    . Right foot      OB History    No data available       Home Medications    Prior to Admission medications   Medication Sig Start Date End Date Taking? Authorizing Provider  lamoTRIgine (LAMICTAL) 100 MG tablet Take 100 mg by mouth daily.   Yes [provider]  cyclobenzaprine (FLEXERIL) 10 MG tablet Take 1 tablet (10 mg total) by mouth 3 (three) times daily as needed for muscle spasms. 06/16/17   Azalia Bilisampos, Aubrii Sharpless, MD  fluticasone (FLONASE) 50 MCG/ACT nasal spray Place 2 sprays into both nostrils daily. 05/25/16   Lurene ShadowPhelps, Erin O, PA-C  ibuprofen (ADVIL,MOTRIN) 600 MG tablet Take 1 tablet (600 mg total) by mouth every 8 (eight) hours as needed. 06/16/17   Azalia Bilisampos, Laquana Villari, MD  norethindrone-ethinyl estradiol (JUNEL FE,GILDESS FE,LOESTRIN FE) 1-20 MG-MCG tablet Take 1 tablet by mouth daily.    [provider]  predniSONE (DELTASONE) 20 MG tablet Take 2 tablets (40 mg total)  by mouth daily for 5 days. 06/16/17 06/21/17  Azalia Bilisampos, Tahirih Lair, MD  ranitidine (ZANTAC) 300 MG tablet Take 300 mg by mouth at bedtime.    [provider]  pantoprazole (PROTONIX) 20 MG tablet Take 20 mg by mouth daily.  04/10/14  [provider]    Family History Family History  Problem Relation Age of Onset  . Cancer Father   . COPD Father     Social History Social History   Tobacco Use  . Smoking status: Never Smoker  . Smokeless tobacco: Never Used  Substance Use Topics  . Alcohol use: No    Frequency: Never  . Drug use: No     Allergies   Codeine   Review of Systems Review of Systems  All other systems reviewed and are negative.    Physical Exam Updated Vital Signs BP (!) 145/87 (BP Location: Left Arm)   Pulse 91   Temp 98.3 F (36.8 C) (Oral)   Resp 18   Ht 5\' 6"  (1.676 m)   Wt 103.4 kg (228 lb)   LMP 06/02/2017 (Exact Date)   SpO2 99%   BMI 36.80 kg/m   Physical Exam  Constitutional: She is oriented to person, place, and time. She appears well-developed and well-nourished.  HENT:  Head: Normocephalic.  Eyes: EOM are normal.  Neck: Normal range of motion. Neck supple.  Mild cervical and paracervical tenderness without cervical step-off.  Pulmonary/Chest: Effort normal.  Abdominal: She exhibits no distension.  Musculoskeletal: Normal range of motion.  Normal radial pulses bilaterally.  5 out of 5 strength of bilateral upper and lower extremity major muscle groups.  Neurological: She is alert and oriented to person, place, and time.  Psychiatric: She has a normal mood and affect.  Nursing note and vitals reviewed.    ED Treatments / Results  Labs (all labs ordered are listed, but only abnormal results are displayed) Labs Reviewed  PREGNANCY, URINE    EKG  EKG Interpretation None       Radiology Dg Cervical Spine Complete  Result Date: 06/16/2017 CLINICAL DATA:  Neck and shoulder pain EXAM: CERVICAL SPINE - COMPLETE 4+  VIEW COMPARISON:  None. FINDINGS: Bursal normal cervical lordosis, likely positional. No evidence of fracture or dislocation. Vertebral body heights and intervertebral disc spaces are maintained. Dens appears intact. Lateral masses of C1 are symmetric. Mild degenerative changes at C5-6. No prevertebral soft tissue swelling. Bilateral neural foramina are patent. IMPRESSION: Negative cervical spine radiographs. Electronically Signed   By: Charline Bills M.D.   On: 06/16/2017 09:53    Procedures Procedures (including critical care time)  Medications Ordered in ED Medications  ibuprofen (ADVIL,MOTRIN) tablet 600 mg (600 mg Oral Given 06/16/17 0948)  predniSONE (DELTASONE) tablet 60 mg (60 mg Oral Given 06/16/17 0949)     Initial Impression / Assessment and Plan / ED Course  I have reviewed the triage vital signs and the nursing notes.  Pertinent labs & imaging results that were available during my care of the patient were reviewed by me and considered in my medical decision making (see chart for details).     Acute cervical strain.  Outpatient primary care follow-up.  Home with anti-inflammatories, steroids and muscle relaxants.  C-spine plain films without osseous abnormality.  If her symptoms persist she may benefit from MRI to evaluate for herniated disc.  Final Clinical Impressions(s) / ED Diagnoses   Final diagnoses:  Acute strain of neck muscle, initial encounter    ED Discharge Orders        Ordered    ibuprofen (ADVIL,MOTRIN) 600 MG tablet  Every 8 hours PRN     06/16/17 1004    predniSONE (DELTASONE) 20 MG tablet  Daily     06/16/17 1004    cyclobenzaprine (FLEXERIL) 10 MG tablet  3 times daily PRN     06/16/17 1004       Azalia Bilis, MD 06/16/17 1009

## 2017-06-16 NOTE — ED Triage Notes (Signed)
Neck and shoulder pain, upper back as well. Pain yesterday after playing ball.

## 2019-01-18 NOTE — Progress Notes (Signed)
Cardiology Office Note:    Date:  01/19/2019   ID:  Tabitha Hill, DOB 11/28/1975, MRN [id number]  PCP:  Patient, No Pcp Per  Cardiologist:  Thomasene RippleKardie Vedh Ptacek, DO  Electrophysiologist:  None   Referring MD: No ref. provider found   " I have been told that my heart rate was low several time so I decided to come get checked out":  History of Present Illness:    Tabitha Hill is a 43 y.o. female with a history of Anxiety, GERD, Seizures who presents to be evaluated for low heart rate. Per patient during her last 2 visit with her GI doctor her heart rate has been in the 40s. She denies any lightheadedness or dizziness, chest pain, nausea or vomiting/  Past Medical History:  Diagnosis Date  . Anxiety   . GERD (gastroesophageal reflux disease)   . Seizures (HCC)     Past Surgical History:  Procedure Laterality Date  . Right Arm    . Right foot      Current Medications: Current Meds  Medication Sig  . lamoTRIgine (LAMICTAL) 150 MG tablet Take 150 mg by mouth 2 (two) times daily.     Allergies:   Codeine   Social History   Socioeconomic History  . Marital status: Single    Spouse name: Not on file  . Number of children: 1  . Years of education: Not on file  . Highest education level: Not on file  Occupational History  . Not on file  Social Needs  . Financial resource strain: Not on file  . Food insecurity    Worry: Not on file    Inability: Not on file  . Transportation needs    Medical: Not on file    Non-medical: Not on file  Tobacco Use  . Smoking status: Never Smoker  . Smokeless tobacco: Never Used  Substance and Sexual Activity  . Alcohol use: No    Frequency: Never  . Drug use: No  . Sexual activity: Not on file  Lifestyle  . Physical activity    Days per week: Not on file    Minutes per session: Not on file  . Stress: Not on file  Relationships  . Social Musicianconnections    Talks on phone: Not on file    Gets together: Not on file    Attends religious  service: Not on file    Active member of club or organization: Not on file    Attends meetings of clubs or organizations: Not on file    Relationship status: Not on file  Other Topics Concern  . Not on file  Social History Narrative  . Not on file     Family History: The patient's family history includes COPD in her father and paternal grandmother; Cancer in her father; Lung cancer in her paternal grandmother; Stomach cancer in her maternal grandmother.  ROS:   Please see the history of present illness.   Review of Systems  Constitution: Negative for diaphoresis, fever and weight loss.  HENT: Positive for sinus pain. Negative for ear pain, hearing loss and nosebleeds.   Eyes: Negative for blurred vision, photophobia and redness.  Cardiovascular: Negative for chest pain, leg swelling, orthopnea and palpitations.  Respiratory: Negative for cough, shortness of breath and wheezing.   Endocrine: Negative for polydipsia.  Skin: Negative for itching.  Musculoskeletal: Positive for back pain and neck pain. Negative for joint pain.  Gastrointestinal: Positive for abdominal pain and constipation. Negative for heartburn and  vomiting.  Genitourinary: Negative for dysuria, frequency and hematuria.  Neurological: Negative for dizziness, sensory change, tingling and weakness.  Psychiatric/Behavioral: Negative for depression. The patient does not have insomnia.   Allergic/Immunologic: Negative for environmental allergies.     EKGs/Labs/Other Studies Reviewed:    The following studies were reviewed today:  EKG:  EKG is ordered today.  The ekg ordered today demonstrates evidence of sinus rhythm, heart rate being 70 bpm with frequent PVCs/bigeminy and evidence of left atrial abnormality.  Recent Labs: No results found for requested labs within last 8760 hours.  Recent Lipid Panel No results found for: CHOL, TRIG, HDL, CHOLHDL, VLDL, LDLCALC, LDLDIRECT  Physical Exam:    VS:  BP 110/90 (BP  Location: Left Arm, Patient Position: Sitting, Cuff Size: Normal)   Pulse 87   Temp 98.2 F (36.8 C)   Ht 5\' 6"  (1.676 m)   Wt 217 lb (98.4 kg)   SpO2 97%   BMI 35.02 kg/m     Wt Readings from Last 3 Encounters:  01/19/19 217 lb (98.4 kg)  06/16/17 228 lb (103.4 kg)  05/25/16 240 lb (108.9 kg)     GEN: Well nourished, well developed in no acute distress HEENT: Normal NECK: No JVD; No carotid bruits LYMPHATICS: No lymphadenopathy CARDIAC: S1S2, RRR, no murmurs, rubs, gallops RESPIRATORY:  Clear to auscultation without rales, wheezing or rhonchi  ABDOMEN: Soft, non-tender, non-distended EXTREMITIES: no edema, no cyanosis, no clubbing MUSCULOSKELETAL:  No edema; No deformity  SKIN: Warm and dry NEUROLOGIC:  Alert and oriented x 3 PSYCHIATRIC:  Normal affect   ASSESSMENT:    1. Premature ventricular contractions    PLAN:    Tabitha Hill was seen in the office today for her reported low heart rate. Her EKG today shows evidence of frequent premature ventricular contractions (PVC). She is asymptomatic. At this time I will get a TTE to assess for LV function and structural abnormalities. In addition a ambulatory monitoring system (zio patch for minimun 3 days to up to 14 days) to assess the PVC burden. Blood work will be performed today which will include bmp, magnesium and TSH.   The patient is in agreement with the above plan. She will follow up in 3 months. The patient left the office in stable condition.    Medication Adjustments/Labs and Tests Ordered: Current medicines are reviewed at length with the patient today.  Concerns regarding medicines are outlined above.  Orders Placed This Encounter  Procedures  . Basic Metabolic Panel (BMET)  . Magnesium  . TSH  . LONG TERM MONITOR (3-14 DAYS)  . ECHOCARDIOGRAM COMPLETE   No orders of the defined types were placed in this encounter.   Patient Instructions  Medication Instructions:  Your physician recommends that you  continue on your current medications as directed. Please refer to the Current Medication list given to you today.  If you need a refill on your cardiac medications before your next appointment, please call your pharmacy.   Lab work: Your physician recommends that you return for lab work in: TODAY BMP,Magnesium,TSH  If you have labs (blood work) drawn today and your tests are completely normal, you will receive your results only by: Marland Kitchen MyChart Message (if you have MyChart) OR . A paper copy in the mail If you have any lab test that is abnormal or we need to change your treatment, we will call you to review the results.  Testing/Procedures: Your physician has requested that you have an echocardiogram. Echocardiography is a  painless test that uses sound waves to create images of your heart. It provides your doctor with information about the size and shape of your heart and how well your heart's chambers and valves are working. This procedure takes approximately one hour. There are no restrictions for this procedure.  Your physician has recommended that you wear a ZIO monitor. ZIO monitors are medical devices that record the heart's electrical activity. Doctors most often use these monitors to diagnose arrhythmias. Arrhythmias are problems with the speed or rhythm of the heartbeat. The monitor is a small, portable device. You can wear one while you do your normal daily activities. This is usually used to diagnose what is causing palpitations/syncope (passing out).  Wear 3 days   Follow-Up: At [facility], you and your health needs are our priority.  As part of our continuing mission to provide you with exceptional heart care, we have created designated Provider Care Teams.  These Care Teams include your primary Cardiologist (physician) and Advanced Practice Providers (APPs -  Physician Assistants and Nurse Practitioners) who all work together to provide you with the care you need, when you need  it. You will need a follow up appointment in 3 months.  Please call our office 2 months in advance to schedule this appointment.  You may see Berniece Salines, DO Any Other Special Instructions Will Be Listed Below (If Applicable).       Rolly Pancake, DO  01/19/2019 12:12 PM    [place]

## 2019-01-19 ENCOUNTER — Encounter: Payer: Self-pay | Admitting: Cardiology

## 2019-01-19 ENCOUNTER — Other Ambulatory Visit: Payer: Self-pay

## 2019-01-19 ENCOUNTER — Ambulatory Visit (INDEPENDENT_AMBULATORY_CARE_PROVIDER_SITE_OTHER): Payer: BC Managed Care – PPO | Admitting: Cardiology

## 2019-01-19 VITALS — BP 110/90 | HR 87 | Temp 98.2°F | Ht 66.0 in | Wt 217.0 lb

## 2019-01-19 DIAGNOSIS — I493 Ventricular premature depolarization: Secondary | ICD-10-CM | POA: Diagnosis not present

## 2019-01-19 NOTE — Patient Instructions (Addendum)
Medication Instructions:  Your physician recommends that you continue on your current medications as directed. Please refer to the Current Medication list given to you today.  If you need a refill on your cardiac medications before your next appointment, please call your pharmacy.   Lab work: Your physician recommends that you return for lab work in: TODAY BMP,Magnesium,TSH  If you have labs (blood work) drawn today and your tests are completely normal, you will receive your results only by: Marland Kitchen MyChart Message (if you have MyChart) OR . A paper copy in the mail If you have any lab test that is abnormal or we need to change your treatment, we will call you to review the results.  Testing/Procedures: Your physician has requested that you have an echocardiogram. Echocardiography is a painless test that uses sound waves to create images of your heart. It provides your doctor with information about the size and shape of your heart and how well your heart's chambers and valves are working. This procedure takes approximately one hour. There are no restrictions for this procedure.  Your physician has recommended that you wear a ZIO monitor. ZIO monitors are medical devices that record the heart's electrical activity. Doctors most often use these monitors to diagnose arrhythmias. Arrhythmias are problems with the speed or rhythm of the heartbeat. The monitor is a small, portable device. You can wear one while you do your normal daily activities. This is usually used to diagnose what is causing palpitations/syncope (passing out).  Wear 3 days   Follow-Up: At [facility], you and your health needs are our priority.  As part of our continuing mission to provide you with exceptional heart care, we have created designated Provider Care Teams.  These Care Teams include your primary Cardiologist (physician) and Advanced Practice Providers (APPs -  Physician Assistants and Nurse Practitioners) who all work  together to provide you with the care you need, when you need it. You will need a follow up appointment in 3 months.  Please call our office 2 months in advance to schedule this appointment.  You may see Berniece Salines, DO Any Other Special Instructions Will Be Listed Below (If Applicable).

## 2019-01-20 LAB — TSH: TSH: 1.95 u[IU]/mL (ref 0.450–4.500)

## 2019-01-20 LAB — BASIC METABOLIC PANEL
BUN/Creatinine Ratio: 13 (ref 9–23)
BUN: 11 mg/dL (ref 6–24)
CO2: 23 mmol/L (ref 20–29)
Calcium: 8.9 mg/dL (ref 8.7–10.2)
Chloride: 103 mmol/L (ref 96–106)
Creatinine, Ser: 0.88 mg/dL (ref 0.57–1.00)
GFR calc Af Amer: 93 mL/min/{1.73_m2} (ref >59)
GFR calc non Af Amer: 81 mL/min/{1.73_m2} (ref >59)
Glucose: 89 mg/dL (ref 65–99)
Potassium: 4.5 mmol/L (ref 3.5–5.2)
Sodium: 139 mmol/L (ref 134–144)

## 2019-01-20 LAB — MAGNESIUM: Magnesium: 2.1 mg/dL (ref 1.6–2.3)

## 2019-01-20 NOTE — Addendum Note (Signed)
Addended by: Shauntelle Jamerson B on: 01/20/2019 10:51 AM   Modules accepted: Orders  

## 2019-01-21 ENCOUNTER — Ambulatory Visit (HOSPITAL_BASED_OUTPATIENT_CLINIC_OR_DEPARTMENT_OTHER)
Admission: RE | Admit: 2019-01-21 | Discharge: 2019-01-21 | Disposition: A | Discharge status: Home / Self Care | Payer: BC Managed Care – PPO | Source: Ambulatory Visit | Attending: Cardiology | Admitting: Cardiology

## 2019-01-21 ENCOUNTER — Other Ambulatory Visit: Payer: Self-pay

## 2019-01-21 DIAGNOSIS — I493 Ventricular premature depolarization: Secondary | ICD-10-CM | POA: Diagnosis not present

## 2019-01-21 NOTE — Progress Notes (Signed)
  Echocardiogram 2D Echocardiogram has been performed.  Tabitha Hill 01/21/2019, 4:06 PM

## 2019-01-25 ENCOUNTER — Ambulatory Visit (INDEPENDENT_AMBULATORY_CARE_PROVIDER_SITE_OTHER): Payer: BC Managed Care – PPO

## 2019-01-25 DIAGNOSIS — I493 Ventricular premature depolarization: Secondary | ICD-10-CM

## 2019-01-26 ENCOUNTER — Telehealth: Payer: Self-pay | Admitting: *Deleted

## 2019-01-26 NOTE — Telephone Encounter (Signed)
-----   Message from Berniece Salines, DO sent at 01/22/2019  5:53 PM EDT ----- Normal echocardiogram, please let her know.

## 2019-01-26 NOTE — Telephone Encounter (Signed)
Telephone call to patient. Left message that echo results were normal and to call with any questions. 

## 2019-02-12 ENCOUNTER — Telehealth: Payer: Self-pay | Admitting: *Deleted

## 2019-02-12 NOTE — Telephone Encounter (Signed)
-----   Message from Berniece Salines, DO sent at 02/12/2019  8:22 AM EDT ----- I called patient however left a message on her voicemail did not need to see her early next week to discuss her monitor results.  Please get her in a scheduled for next week at [facility]

## 2019-02-12 NOTE — Telephone Encounter (Signed)
Telephone call to patient. Left message to return call to schedule an appointment in the [facility] office next week.

## 2019-02-18 ENCOUNTER — Encounter: Payer: Self-pay | Admitting: Cardiology

## 2019-02-18 ENCOUNTER — Other Ambulatory Visit: Payer: Self-pay

## 2019-02-18 ENCOUNTER — Ambulatory Visit (INDEPENDENT_AMBULATORY_CARE_PROVIDER_SITE_OTHER): Payer: BC Managed Care – PPO | Admitting: Cardiology

## 2019-02-18 VITALS — BP 90/62 | HR 96 | Ht 66.0 in | Wt 213.0 lb

## 2019-02-18 DIAGNOSIS — I493 Ventricular premature depolarization: Secondary | ICD-10-CM | POA: Diagnosis not present

## 2019-02-18 DIAGNOSIS — R079 Chest pain, unspecified: Secondary | ICD-10-CM | POA: Diagnosis not present

## 2019-02-18 DIAGNOSIS — R5383 Other fatigue: Secondary | ICD-10-CM | POA: Diagnosis not present

## 2019-02-18 MED ORDER — METOPROLOL TARTRATE 50 MG PO TABS: ORAL_TABLET | ORAL | 0 refills | Status: DC

## 2019-02-18 NOTE — Patient Instructions (Addendum)
Medication Instructions:  Your physician recommends that you continue on your current medications as directed. Please refer to the Current Medication list given to you today.  If you need a refill on your cardiac medications before your next appointment, please call your pharmacy.   Lab work: None Ordered  Testing/Procedures: Your physician has requested that you have cardiac CT. Cardiac computed tomography (CT) is a painless test that uses an x-ray machine to take clear, detailed pictures of your heart. For further information please visit [facility]. Please follow instruction sheet as given.    Follow-Up: At [facility], you and your health needs are our priority.  As part of our continuing mission to provide you with exceptional heart care, we have created designated Provider Care Teams.  These Care Teams include your primary Cardiologist (physician) and Advanced Practice Providers (APPs -  Physician Assistants and Nurse Practitioners) who all work together to provide you with the care you need, when you need it.  Dr. Harriet Masson in 2 months  Any Other Special Instructions Will Be Listed Below (If Applicable).   Your cardiac CT will be scheduled at one of the below locations:   [facility] [address] [place], [place] [address] [phone]  [facility], please arrive at the [facility] main entrance of [facility] 30-45 minutes prior to test start time. Proceed to the [facility] Radiology Department (first floor) to check-in and test prep.  Please follow these instructions carefully (unless otherwise directed):   On the Night Before the Test: . Be sure to Drink plenty of water. . Do not consume any caffeinated/decaffeinated beverages or chocolate 12 hours prior to your test. . Do not take any antihistamines 12 hours prior to your test.  On the Day of the Test: . Drink plenty of water. Do not drink any water within one hour of the  test. . Do not eat any food 4 hours prior to the test. . You may take your regular medications prior to the test.  . FEMALES- please wear underwire-free bra if available   *For Clinical Staff only. Please instruct patient the following:*        -Drink plenty of water       -Check heart rate       -Take metoprolol (Lopressor) 2 hours prior to test (if applicable).          -If HR is less than 55 BPM- No Beta Blocker                    After the Test: . Drink plenty of water. . After receiving IV contrast, you may experience a mild flushed feeling. This is normal. . On occasion, you may experience a mild rash up to 24 hours after the test. This is not dangerous. If this occurs, you can take Benadryl 25 mg and increase your fluid intake. . If you experience trouble breathing, this can be serious. If it is severe call 911 IMMEDIATELY. If it is mild, please call our office    Please contact the cardiac imaging nurse navigator should you have any questions/concerns Marchia Bond, RN Navigator Cardiac Imaging [facility] [phone] Office  [phone] Cell   We will initiate referrals to Dr. Caryl Comes (EP) and Dr. Charlett Blake (PCP)

## 2019-02-18 NOTE — Progress Notes (Signed)
Cardiology Office Note:    Date:  02/18/2019   ID:  Tabitha Hill, DOB Jan 26, 1976, MRN [id number]  PCP:  Default, Provider, MD  Cardiologist:  Thomasene Ripple, DO  Electrophysiologist:  None   Referring MD: No ref. provider found   Chief Complaint  Patient presents with  . Follow-up    Testing    History of Present Illness:    Tabitha Hill is a 43 y.o. female with a hx of GERD, seizures who I initially saw on January 19, 2023 after she was referred by her gastroenterologist who be evaluated by cardiology due to low heart rate.  She stated heart rates in the 40s.  On the day of evaluation her EKG performed showed evidence of sinus rhythm heart rate 70 bpm with frequent PVCs/bigeminy.  At the conclusion of her visit I recommended the patient wear a ZIO monitoring for minimum 3 days as well cardiogram to assess her LV function.  Blood work was also performed.   Interim patient was able to undergo all of the testing.  She is here today to discuss all the results.  However she tells me since her last visit she has had some mid and chest tightness which last anywhere from 4 to 10 minutes.  10 she said he feels as if someone is pushing on her chest.  She denies any shortness of breath.  But admits to a great deal of fatigue.  He tells me " I am starting to feel tired all the time."  No other complaints.  Past Medical History:  Diagnosis Date  . Anxiety   . GERD (gastroesophageal reflux disease)   . Seizures (HCC)     Past Surgical History:  Procedure Laterality Date  . Right Arm    . Right foot      Current Medications: Current Meds  Medication Sig  . lamoTRIgine (LAMICTAL) 150 MG tablet Take 150 mg by mouth 2 (two) times daily.     Allergies:   Codeine   Social History   Socioeconomic History  . Marital status: Single    Spouse name: Not on file  . Number of children: 1  . Years of education: Not on file  . Highest education level: Not on file  Occupational  History  . Not on file  Social Needs  . Financial resource strain: Not on file  . Food insecurity    Worry: Not on file    Inability: Not on file  . Transportation needs    Medical: Not on file    Non-medical: Not on file  Tobacco Use  . Smoking status: Never Smoker  . Smokeless tobacco: Never Used  Substance and Sexual Activity  . Alcohol use: No    Frequency: Never  . Drug use: No  . Sexual activity: Not on file  Lifestyle  . Physical activity    Days per week: Not on file    Minutes per session: Not on file  . Stress: Not on file  Relationships  . Social Musician on phone: Not on file    Gets together: Not on file    Attends religious service: Not on file    Active member of club or organization: Not on file    Attends meetings of clubs or organizations: Not on file    Relationship status: Not on file  Other Topics Concern  . Not on file  Social History Narrative  . Not on file     Family  History: The patient's family history includes COPD in her father and paternal grandmother; Cancer in her father; Lung cancer in her paternal grandmother; Stomach cancer in her maternal grandmother.  ROS:   Review of Systems  Constitution: Reports fatigue.  Negative for decreased appetite, fever and weight gain.  HENT: Negative for congestion, ear discharge, hoarse voice and sore throat.   Eyes: Negative for discharge, redness, vision loss in right eye and visual halos.  Cardiovascular: Reports chest pain.  Negative for dyspnea on exertion, leg swelling, orthopnea and palpitations.  Respiratory: Negative for cough, hemoptysis, shortness of breath and snoring.   Endocrine: Negative for heat intolerance and polyphagia.  Hematologic/Lymphatic: Negative for bleeding problem. Does not bruise/bleed easily.  Skin: Negative for flushing, nail changes, rash and suspicious lesions.  Musculoskeletal: Negative for arthritis, joint pain, muscle cramps, myalgias, neck pain and  stiffness.  Gastrointestinal: Negative for abdominal pain, bowel incontinence, diarrhea and excessive appetite.  Genitourinary: Negative for decreased libido, genital sores and incomplete emptying.  Neurological: Negative for brief paralysis, focal weakness, headaches and loss of balance.  Psychiatric/Behavioral: Negative for altered mental status, depression and suicidal ideas.  Allergic/Immunologic: Negative for HIV exposure and persistent infections.    EKGs/Labs/Other Studies Reviewed:    The following studies were reviewed today:  EKG:  None today.  Long-term monitor interpretation: The patient wore the monitor for 3 days and 4 hours.   The minimal heart rate was 70 bpm. The maximal heart rate was 136 bpm. The average heart rate was 94 bpm.  Predominant rhythm was sinus. There are frequent ventricular ectopy averaging 25%/ 24-hour (including bigeminy and trigeminy). The total ventricular ectopy was 25.7% (110,0034) over the duration of the study. There are no supraventricular tachycardia. There are no pauses. There are no AV blocks.  Daily diary unavailable.   TTE IMPRESSIONS 01/21/2019  1. The left ventricle has normal systolic function, with an ejection fraction of 55-60%. The cavity size was normal. Left ventricular diastolic parameters were normal. No evidence of left ventricular regional wall motion abnormalities.  2. The right ventricle has normal systolic function. The cavity was normal. There is no increase in right ventricular wall thickness.  3. The aortic root and ascending aorta are normal in size and structure.  4. No evidence of mitral valve stenosis.  5. The aortic valve is tricuspid. No stenosis of the aortic valve.   Recent Labs: 01/19/2019: BUN 11; Creatinine, Ser 0.88; Magnesium 2.1; Potassium 4.5; Sodium 139; TSH 1.950  Recent Lipid Panel No results found for: CHOL, TRIG, HDL, CHOLHDL, VLDL, LDLCALC, LDLDIRECT  Physical Exam:    VS:  BP 90/62 (BP  Location: Right Arm, Patient Position: Sitting, Cuff Size: Normal)   Pulse 96   Ht 5\' 6"  (1.676 m)   Wt 213 lb (96.6 kg)   SpO2 97%   BMI 34.38 kg/m     Wt Readings from Last 3 Encounters:  02/18/19 213 lb (96.6 kg)  01/19/19 217 lb (98.4 kg)  06/16/17 228 lb (103.4 kg)     GEN: Well nourished, well developed in no acute distress HEENT: Normal NECK: No JVD; No carotid bruits LYMPHATICS: No lymphadenopathy CARDIAC: S1S2 noted,RRR, no murmurs, rubs, gallops RESPIRATORY:  Clear to auscultation without rales, wheezing or rhonchi  ABDOMEN: Soft, non-tender, non-distended, +bowel sounds, no guarding. EXTREMITIES: No edema, No cyanosis, no clubbing MUSCULOSKELETAL:  No edema; No deformity  SKIN: Warm and dry NEUROLOGIC:  Alert and oriented x 3, non-focal PSYCHIATRIC:  Normal affect, good insight  ASSESSMENT:  1. Frequent PVCs   2. Chest pain, unspecified type   3. Fatigue, unspecified type    PLAN:    1.  Her long-term long-term revealed total ventricular ectopy was 25.7% (110,0034) over the duration of the study.  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  She now reports generalized fatigue.  Thankfully her cardiogram shows evidence of normal LV systolic function with no evidence of any PVC induced cardiomyopathy.  I am going to refer the patient to EP colleagues (prefers to see Dr. Graciela Husbands) for evaluation in the setting of her PVCs and any further recommendations.  2.  Given her chest pain or significant PVC burden is reasonable to do a re-stratification for coronary artery disease therefore a CTA coronaries have been ordered.  Patient was educated on this test that she does not have any contrast allergies.  3.  For her fatigue, I advise patient that she should consider working with her pcp to rule out obstructive sleep apnea.  All of her questions were answered to her satisfaction. The patient is in agreement with the above plan. The patient left the office  in stable condition.  The patient will follow up in 2 months.   Medication Adjustments/Labs and Tests Ordered: Current medicines are reviewed at length with the patient today.  Concerns regarding medicines are outlined above.  Orders Placed This Encounter  Procedures  . CT CORONARY MORPH W/CTA COR W/SCORE W/CA W/CM &/OR WO/CM  . CT CORONARY FRACTIONAL FLOW RESERVE DATA PREP  . CT CORONARY FRACTIONAL FLOW RESERVE FLUID ANALYSIS   Meds ordered this encounter  Medications  . metoprolol tartrate (LOPRESSOR) 50 MG tablet    Sig: 2 hours prior to procedure, check heartrate. If heart rate greater than 55 take 2 tablets (100 mg)    Dispense:  2 tablet    Refill:  0    Patient Instructions  Medication Instructions:  Your physician recommends that you continue on your current medications as directed. Please refer to the Current Medication list given to you today.  If you need a refill on your cardiac medications before your next appointment, please call your pharmacy.   Lab work: None Ordered  Testing/Procedures: Your physician has requested that you have cardiac CT. Cardiac computed tomography (CT) is a painless test that uses an x-ray machine to take clear, detailed pictures of your heart. For further information please visit [email or web address] Please follow instruction sheet as given.    Follow-Up: At [facility], you and your health needs are our priority.  As part of our continuing mission to provide you with exceptional heart care, we have created designated Provider Care Teams.  These Care Teams include your primary Cardiologist (physician) and Advanced Practice Providers (APPs -  Physician Assistants and Nurse Practitioners) who all work together to provide you with the care you need, when you need it.  Dr. Servando Salina in 2 months  Any Other Special Instructions Will Be Listed Below (If Applicable).   Your cardiac CT will be scheduled at one of the below locations:   [facility] [address] [place], [place] [address] [phone]  [facility], please arrive at the [facility] main entrance of [facility] 30-45 minutes prior to test start time. Proceed to the [facility] Radiology Department (first floor) to check-in and test prep.  Please follow these instructions carefully (unless otherwise directed):   On the Night Before the Test: . Be sure to Drink plenty of  water. . Do not consume any caffeinated/decaffeinated beverages or chocolate 12 hours prior to your test. . Do not take any antihistamines 12 hours prior to your test.  On the Day of the Test: . Drink plenty of water. Do not drink any water within one hour of the test. . Do not eat any food 4 hours prior to the test. . You may take your regular medications prior to the test.  . FEMALES- please wear underwire-free bra if available   *For Clinical Staff only. Please instruct patient the following:*        -Drink plenty of water       -Check heart rate       -Take metoprolol (Lopressor) 2 hours prior to test (if applicable).          -If HR is less than 55 BPM- No Beta Blocker                    After the Test: . Drink plenty of water. . After receiving IV contrast, you may experience a mild flushed feeling. This is normal. . On occasion, you may experience a mild rash up to 24 hours after the test. This is not dangerous. If this occurs, you can take Benadryl 25 mg and increase your fluid intake. . If you experience trouble breathing, this can be serious. If it is severe call 911 IMMEDIATELY. If it is mild, please call our office    Please contact the cardiac imaging nurse navigator should you have any questions/concerns Marchia Bond, RN Navigator Cardiac Imaging [facility] [phone] Office  [phone] Cell   We will initiate referrals to Dr. Caryl Comes (EP) and Dr. Charlett Blake (PCP)    Adopting a Healthy Lifestyle.  Know what a  healthy weight is for you (roughly BMI <25) and aim to maintain this   Aim for 7+ servings of fruits and vegetables daily   65-80+ fluid ounces of water or unsweet tea for healthy kidneys   Limit to max 1 drink of alcohol per day; avoid smoking/tobacco   Limit animal fats in diet for cholesterol and heart health - choose grass fed whenever available   Avoid highly processed foods, and foods high in saturated/trans fats   Aim for low stress - take time to unwind and care for your mental health   Aim for 150 min of moderate intensity exercise weekly for heart health, and weights twice weekly for bone health   Aim for 7-9 hours of sleep daily   When it comes to diets, agreement about the perfect plan isnt easy to find, even among the experts. Experts at the [employer] developed an idea known as the Healthy Eating Plate. Just imagine a plate divided into logical, healthy portions.   The emphasis is on diet quality:   Load up on vegetables and fruits - one-half of your plate: Aim for color and variety, and remember that potatoes dont count.   Go for whole grains - one-quarter of your plate: Whole wheat, barley, wheat berries, quinoa, oats, brown rice, and foods made with them. If you want pasta, go with whole wheat pasta.   Protein power - one-quarter of your plate: Fish, chicken, beans, and nuts are all healthy, versatile protein sources. Limit red meat.   The diet, however, does go beyond the plate, offering a few other suggestions.   Use healthy plant oils, such as olive, canola, soy, corn, sunflower and peanut.  Check the labels, and avoid partially hydrogenated oil, which have unhealthy trans fats.   If youre thirsty, drink water. Coffee and tea are good in moderation, but skip sugary drinks and limit milk and dairy products to one or two daily servings.   The type of carbohydrate in the diet is more important than the amount. Some sources of carbohydrates,  such as vegetables, fruits, whole grains, and beans-are healthier than others.   Finally, stay active  Signed, Thomasene RippleKardie Shavanna Furnari, DO  02/18/2019 10:22 AM    [facility]

## 2019-02-22 NOTE — Addendum Note (Signed)
Addended by: Polly Cobia A on: 02/22/2019 05:00 PM   Modules accepted: Orders

## 2019-02-25 ENCOUNTER — Other Ambulatory Visit: Payer: Self-pay

## 2019-02-26 ENCOUNTER — Other Ambulatory Visit: Payer: Self-pay

## 2019-02-26 ENCOUNTER — Telehealth: Payer: Self-pay

## 2019-02-26 DIAGNOSIS — Z87898 Personal history of other specified conditions: Secondary | ICD-10-CM

## 2019-02-26 NOTE — Telephone Encounter (Signed)
Phoned patient, informed that Dr. Harriet Masson would like her to establish care with PCP, Dr. Charlett Blake. Gave contact information for her office. Also informed that pre-cert request has been sent for Sleep Study and CTA and staff will call her to schedule those once approval received. Verbal understanding received, no further questions or concerns.

## 2019-03-02 ENCOUNTER — Telehealth: Payer: Self-pay | Admitting: *Deleted

## 2019-03-02 NOTE — Telephone Encounter (Signed)
Staff message sent to Polly Cobia per [place] no [place] is required for in lab sleep study. Call reference # [id number].

## 2019-03-03 ENCOUNTER — Telehealth: Payer: Self-pay

## 2019-03-03 NOTE — Telephone Encounter (Signed)
Phoned patient, informed that sleep study scheduled for Thursday Nov. 5, 2020 at 8 pm and given address. Also informed of COVID test required prior to study, scheduled for Mon. Nov 2nd, 2020 at 1315. Address and instructions for COVID test given to patient as well as need to self-quarantine from time of COVID test till sleep study. She verbalizes understanding. No further questions or concerns.   COVID study ordered with instructions for Dr. Claiborne Billings to read.

## 2019-03-08 ENCOUNTER — Other Ambulatory Visit (HOSPITAL_COMMUNITY): Payer: BC Managed Care – PPO

## 2019-03-11 ENCOUNTER — Encounter (HOSPITAL_BASED_OUTPATIENT_CLINIC_OR_DEPARTMENT_OTHER): Payer: BC Managed Care – PPO | Admitting: Cardiovascular Disease

## 2019-03-15 ENCOUNTER — Other Ambulatory Visit (HOSPITAL_COMMUNITY)
Admission: RE | Admit: 2019-03-15 | Discharge: 2019-03-15 | Disposition: A | Discharge status: Home / Self Care | Payer: BC Managed Care – PPO | Source: Ambulatory Visit | Attending: Cardiovascular Disease | Admitting: Cardiovascular Disease

## 2019-03-15 DIAGNOSIS — Z20828 Contact with and (suspected) exposure to other viral communicable diseases: Secondary | ICD-10-CM | POA: Insufficient documentation

## 2019-03-15 DIAGNOSIS — Z01812 Encounter for preprocedural laboratory examination: Secondary | ICD-10-CM | POA: Diagnosis not present

## 2019-03-16 LAB — NOVEL CORONAVIRUS, NAA (HOSP ORDER, SEND-OUT TO REF LAB; TAT 18-24 HRS): SARS-CoV-2, NAA: NOT DETECTED

## 2019-03-18 ENCOUNTER — Other Ambulatory Visit: Payer: Self-pay

## 2019-03-18 ENCOUNTER — Ambulatory Visit (HOSPITAL_BASED_OUTPATIENT_CLINIC_OR_DEPARTMENT_OTHER): Payer: BC Managed Care – PPO | Attending: Cardiology | Admitting: Cardiovascular Disease

## 2019-03-18 DIAGNOSIS — R0683 Snoring: Secondary | ICD-10-CM

## 2019-03-18 DIAGNOSIS — Z87898 Personal history of other specified conditions: Secondary | ICD-10-CM | POA: Diagnosis not present

## 2019-03-26 ENCOUNTER — Telehealth: Payer: Self-pay

## 2019-03-26 DIAGNOSIS — R079 Chest pain, unspecified: Secondary | ICD-10-CM

## 2019-03-26 DIAGNOSIS — I493 Ventricular premature depolarization: Secondary | ICD-10-CM

## 2019-03-26 NOTE — Telephone Encounter (Signed)
Phoned patient, informed to come to our office 5-7 days prior to CTA to have labs drawn. Informed of lab hours, verbal understanding received.

## 2019-03-28 ENCOUNTER — Encounter (HOSPITAL_BASED_OUTPATIENT_CLINIC_OR_DEPARTMENT_OTHER): Payer: Self-pay | Admitting: Cardiovascular Disease

## 2019-03-28 NOTE — Procedures (Signed)
    Patient Name: Tabitha Hill, Tabitha Hill Date: 03/18/2019 Gender: Female D.O.B: [birth year] Age (years): 10 Referring Provider: Godfrey Pick Tobb DO Height (inches): 66 Interpreting Physician: Shelva Majestic MD, ABSM Weight (lbs): 215 RPSGT: Zadie Rhine BMI: 35 MRN: [id number] Neck Size: 15.50  CLINICAL INFORMATION Sleep Study Type: NPSG  Indication for sleep study: Fatigue, Snoring  Epworth Sleepiness Score: 7  SLEEP STUDY TECHNIQUE As per the AASM Manual for the Scoring of Sleep and Associated Events v2.3 (April 2016) with a hypopnea requiring 4% desaturations.  The channels recorded and monitored were frontal, central and occipital EEG, electrooculogram (EOG), submentalis EMG (chin), nasal and oral airflow, thoracic and abdominal wall motion, anterior tibialis EMG, snore microphone, electrocardiogram, and pulse oximetry.  MEDICATIONS     lamoTRIgine (LAMICTAL) 150 MG tablet         metoprolol tartrate (LOPRESSOR) 50 MG tablet  Medications self-administered by patient taken the night of the study : N/A  SLEEP ARCHITECTURE The study was initiated at 10:11:17 PM and ended at 4:36:59 AM.  Sleep onset time was 9.2 minutes and the sleep efficiency was 91.3%%. The total sleep time was 352 minutes.  Stage REM latency was 118.0 minutes.  The patient spent 4.0%% of the night in stage N1 sleep, 74.7%% in stage N2 sleep, 0.0%% in stage N3 and 21.3% in REM.  Alpha intrusion was absent.  Supine sleep was 32.20%.  RESPIRATORY PARAMETERS The overall apnea/hypopnea index (AHI) was 0.0 per hour. There were 0 total apneas, including 0 obstructive, 0 central and 0 mixed apneas. There were 0 hypopneas and 43 RERAs.  The AHI during Stage REM sleep was 0.0 per hour.  AHI while supine was 0.0 per hour.  The mean oxygen saturation was 94.2%. The minimum SpO2 during sleep was 91.0%.  Loud snoring was noted during this study.  CARDIAC DATA The 2 lead EKG demonstrated sinus rhythm. The  mean heart rate was 81.7 beats per minute. Other EKG findings include: PVCs.  LEG MOVEMENT DATA The total PLMS were 0 with a resulting PLMS index of 0.0. Associated arousal with leg movement index was 0.0 .  IMPRESSIONS - No significant obstructive sleep apnea occurred during this study (AHI = 0.0/h). - No significant central sleep apnea occurred during this study (CAI = 0.0/h). - The patient had minimal or no oxygen desaturation during the study (Min O2 = 91.0%) - The patient snored with loud snoring volume. - EKG findings include PVCs. - Clinically significant periodic limb movements did not occur during sleep. No significant associated arousals.  DIAGNOSIS - Snoring  RECOMMENDATIONS - There is no indication for CPAP therapy. - Consider alternatives for the treatment of loud snoring; consider ENT evaluation.  - Avoid alcohol, sedatives and other CNS depressants that may worsen sleep apnea and disrupt normal sleep architecture. - Sleep hygiene should be reviewed to assess factors that may improve sleep quality. - Weight management (BMI 35) and regular exercise should be initiated or continued if appropriate.  [Electronically signed] 03/28/2019 05:58 PM  Shelva Majestic MD, [facility] [place], American Board of Sleep Medicine   NPI: [id number] [place] PH: [phone]   FX: [phone] [place]

## 2019-03-30 ENCOUNTER — Telehealth: Payer: Self-pay | Admitting: *Deleted

## 2019-03-30 NOTE — Telephone Encounter (Signed)
Patient notified of negative sleep study results.

## 2019-03-31 LAB — BASIC METABOLIC PANEL
BUN/Creatinine Ratio: 10 (ref 9–23)
BUN: 8 mg/dL (ref 6–24)
CO2: 23 mmol/L (ref 20–29)
Calcium: 8.9 mg/dL (ref 8.7–10.2)
Chloride: 103 mmol/L (ref 96–106)
Creatinine, Ser: 0.8 mg/dL (ref 0.57–1.00)
GFR calc Af Amer: 104 mL/min/{1.73_m2} (ref >59)
GFR calc non Af Amer: 91 mL/min/{1.73_m2} (ref >59)
Glucose: 86 mg/dL (ref 65–99)
Potassium: 4.4 mmol/L (ref 3.5–5.2)
Sodium: 140 mmol/L (ref 134–144)

## 2019-04-01 ENCOUNTER — Ambulatory Visit: Payer: BC Managed Care – PPO | Admitting: Internal Medicine

## 2019-04-01 ENCOUNTER — Encounter: Payer: Self-pay | Admitting: Internal Medicine

## 2019-04-01 ENCOUNTER — Telehealth: Payer: Self-pay | Admitting: Cardiology

## 2019-04-01 ENCOUNTER — Other Ambulatory Visit: Payer: Self-pay

## 2019-04-01 DIAGNOSIS — I493 Ventricular premature depolarization: Secondary | ICD-10-CM | POA: Insufficient documentation

## 2019-04-01 NOTE — Telephone Encounter (Signed)
New Message:  Patient had some dental work done yesterday and was prescribed Prednisone from the dentist. She wanted to know if Dr. Harriet Masson thought it was safe for her to take the prednisone or not.

## 2019-04-01 NOTE — Telephone Encounter (Signed)
Telephone call to patient. Informed it is OK to take prednisone per Dr Harriet Masson.

## 2019-04-01 NOTE — Patient Instructions (Addendum)
Medication Instructions:  Your physician recommends that you continue on your current medications as directed. Please refer to the Current Medication list given to you today.  Labwork: None ordered.  Testing/Procedures: None ordered.  Follow-Up: Your physician wants you to follow-up in: 3 months with Dr. Caryl Comes.     Virtual visit   Any Other Special Instructions Will Be Listed Below (If Applicable).  If you need a refill on your cardiac medications before your next appointment, please call your pharmacy.

## 2019-04-01 NOTE — Telephone Encounter (Signed)
Please let her know it is ok for her to take the prednisone.

## 2019-04-01 NOTE — Progress Notes (Signed)
ELECTROPHYSIOLOGY CONSULT NOTE  Patient ID: Tabitha Hill, MRN: [id number], DOB/AGE: Sep 03, 1975 43 y.o. Admit date: (Not on file) Date of Consult: 04/01/2019  Primary Physician: Default, Provider, MD Primary Cardiologist: Danilyn Cocke is a 43 y.o. female who is being seen today for the evaluation of PVCs at the request of Dr Harriet Masson.    HPI Tabitha Hill is a 43 y.o. female seen with documented PVCs.  Looking at her smart watch, retrospectively, she noted that it was a change in her heart rate in late June.  Multiple times in the month prior to looking back, she had gone to the doctor's and they told her that she had a slow heart rate.  This prompted an evaluation which included electrocardiogram showing frequent ventricular ectopy.  She was seen by Dr. Harriet Masson.  Echocardiogram demonstrated normal LV function.  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  There was 100% concordance multiple morphologies.  She has noted some worsening exercise tolerance manifested by dyspnea.  She has had no palpitations.  No edema.  She did have transient abdominal distention which resolved spontaneously.  Past medical history is notable for seizure disorder.  This was unconfirmed by EEG apparently However, her symptoms were eliminated by her antiepileptic.   DATE TEST EF   9/20 Echo  55-65 %         Date PVC %  9/20 20            Past Medical History:  Diagnosis Date  . Anxiety   . GERD (gastroesophageal reflux disease)   . 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 Seizures [facility])       Surgical History:  Past Surgical History:  Procedure Laterality Date  . Right Arm    . Right foot       Home Meds: Current Meds  Medication Sig  . lamoTRIgine (LAMICTAL) 150 MG tablet Take 150 mg by mouth 2 (two) times daily.  . metoprolol tartrate (LOPRESSOR) 50 MG tablet 2 hours prior to procedure, check heartrate. If heart  rate greater than 55 take 2 tablets (100 mg)    Allergies:  Allergies  Allergen Reactions  . Codeine     Social History   Socioeconomic History  . Marital status: Single    Spouse name: Not on file  . Number of children: 1  . Years of education: Not on file  . Highest education level: Not on file  Occupational History  . Not on file  Social Needs  . Financial resource strain: Not on file  . Food insecurity    Worry: Not on file    Inability: Not on file  . Transportation needs    Medical: Not on file    Non-medical: Not on file  Tobacco Use  . Smoking status: Never Smoker  . Smokeless tobacco: Never Used  Substance and Sexual Activity  . Alcohol use: No    Frequency: Never  . Drug use: No  . Sexual activity: Not on file  Lifestyle  . Physical activity    Days per week: Not on file    Minutes per session: Not on file  . Stress: Not on file  Relationships  . Social Herbalist on phone: Not on file    Gets together: Not on file    Attends religious service: Not on file    Active member of club or organization: Not on file  Attends meetings of clubs or organizations: Not on file    Relationship status: Not on file  . Intimate partner violence    Fear of current or ex partner: Not on file    Emotionally abused: Not on file    Physically abused: Not on file    Forced sexual activity: Not on file  Other Topics Concern  . Not on file  Social History Narrative  . Not on file     Family History  Problem Relation Age of Onset  . Cancer Father   . COPD Father   . Stomach cancer Maternal Grandmother   . COPD Paternal Grandmother   . Lung cancer Paternal Grandmother      ROS:  Please see the history of present illness.     All other systems reviewed and negative.    Physical Exam:  Blood pressure 122/82, pulse 76, height 5\' 6"  (1.676 m), weight 210 lb 6.4 oz (95.4 kg), SpO2 99 %. General: Well developed, well nourished female in no acute  distress. Head: Normocephalic, atraumatic, sclera non-icteric, no xanthomas, nares are without discharge. EENT: normal  Lymph Nodes:  none Neck: Negative for carotid bruits. JVD not elevated. Back:without scoliosis kyphosis  Lungs: Clear bilaterally to auscultation without wheezes, rales, or rhonchi. Breathing is unlabored. Heart: Irregular RR with S1 S2. No  murmur . No rubs, or gallops appreciated. Abdomen: Soft, non-tender, non-distended with normoactive bowel sounds. No hepatomegaly. No rebound/guarding. No obvious abdominal masses. Msk:  Strength and tone appear normal for age. Extremities: No clubbing or cyanosis. No edema.  Distal pedal pulses are 2+ and equal bilaterally. Skin: Warm and Dry Neuro: Alert and oriented X 3. CN III-XII intact Grossly normal sensory and motor function . Psych:  Responds to questions appropriately with a normal affect.      Labs: Cardiac Enzymes No results for input(s): CKTOTAL, CKMB, TROPONINI in the last 72 hours. CBC No results found for: WBC, HGB, HCT, MCV, PLT PROTIME: No results for input(s): LABPROT, INR in the last 72 hours. Chemistry  Recent Labs  Lab 03/30/19 0848  NA 140  K 4.4  CL 103  CO2 23  BUN 8  CREATININE 0.80  CALCIUM 8.9  GLUCOSE 86   Lipids No results found for: CHOL, HDL, LDLCALC, TRIG BNP No results found for: PROBNP Thyroid Function Tests: No results for input(s): TSH, T4TOTAL, T3FREE, THYROIDAB in the last 72 hours.  Invalid input(s): FREET3 Miscellaneous No results found for: DDIMER  Radiology/Studies:  No results found.  EKG:   9/20 Personally reviewed sinus rhythm with frequent PVCs with a left bundle inferior axis morphology consistent with a origin in the RVOT transition V3-V4  Assessment and Plan:  PVCs-RVOT  Seizure disorder  The patient has a probable 08-6445-month history of frequent PVCs emanating from the right ventricular outflow tract.  She has some changes in exercise tolerance.  Hence,  not withstanding that her LV function remains normal, have discussed initiating a calcium/beta-blocker.  I have given her prescriptions for verapamil and diltiazem 120.  Next steps will depend on the results of her cMRI.  If it showed scar, then would be more aggressive in antiarrhythmic/ablation suppression so as to avoid irreversible LV dysfunction.  If it shows LV dysfunction would also be more aggressive.  She is disinclined towards long-term medical therapy.  Hence, she may elect for catheter ablation as her second treatment option.  It is curious that she also has a seizure disorder.  However, given the interlude  of time for her seizures, about 9 years ago without healthcare notation of bradycardia makes me think it is unlikely that she could have had ventricular tachycardia causing syncope and a secondary seizure way back then.  There have been interesting papers talking about the presence of similar ion channels in heart and brain but I will have to look back at the literature regarding concomitant seizures and arrhythmias.    Sherryl Manges

## 2019-04-02 ENCOUNTER — Telehealth (HOSPITAL_COMMUNITY): Payer: Self-pay | Admitting: Emergency Medicine

## 2019-04-02 NOTE — Telephone Encounter (Signed)
Reaching out to patient to offer assistance regarding upcoming cardiac imaging study; pt verbalizes understanding of appt date/time, parking situation and where to check in, pre-test NPO status and medications ordered, and verified current allergies; name and call back number provided for further questions should they arise Ashari Llewellyn RN Navigator Cardiac Imaging [facility] [phone] office [phone] cell 

## 2019-04-05 ENCOUNTER — Ambulatory Visit (HOSPITAL_COMMUNITY)
Admission: RE | Admit: 2019-04-05 | Discharge: 2019-04-05 | Disposition: A | Discharge status: Home / Self Care | Payer: BC Managed Care – PPO | Source: Ambulatory Visit | Attending: Cardiology | Admitting: Cardiology

## 2019-04-05 ENCOUNTER — Encounter: Payer: BC Managed Care – PPO | Admitting: *Deleted

## 2019-04-05 ENCOUNTER — Other Ambulatory Visit: Payer: Self-pay

## 2019-04-05 DIAGNOSIS — R079 Chest pain, unspecified: Secondary | ICD-10-CM | POA: Insufficient documentation

## 2019-04-05 DIAGNOSIS — Z006 Encounter for examination for normal comparison and control in clinical research program: Secondary | ICD-10-CM

## 2019-04-05 MED ORDER — NITROGLYCERIN 0.4 MG SL SUBL
SUBLINGUAL_TABLET | SUBLINGUAL | Status: AC
  Filled 2019-04-05: qty 2

## 2019-04-05 MED ORDER — IOHEXOL 350 MG/ML SOLN
80.0000 mL | Freq: Once | INTRAVENOUS | Status: AC | PRN
  Administered 2019-04-05: 80 mL via INTRAVENOUS

## 2019-04-05 MED ORDER — METOPROLOL TARTRATE 5 MG/5ML IV SOLN
INTRAVENOUS | Status: AC
  Filled 2019-04-05: qty 5

## 2019-04-05 MED ORDER — NITROGLYCERIN 0.4 MG SL SUBL
0.8000 mg | SUBLINGUAL_TABLET | SUBLINGUAL | Status: DC | PRN
Start: 2019-04-05 — End: 2019-04-06
  Administered 2019-04-05: 0.8 mg via SUBLINGUAL

## 2019-04-05 MED ORDER — METOPROLOL TARTRATE 5 MG/5ML IV SOLN
5.0000 mg | INTRAVENOUS | Status: DC | PRN
Start: 2019-04-05 — End: 2019-04-06

## 2019-04-05 NOTE — Research (Signed)
CADFEM Informed Consent                  Subject Name:   Tabitha Hill   Subject met inclusion and exclusion criteria.  The informed consent form, study requirements and expectations were reviewed with the subject and questions and concerns were addressed prior to the signing of the consent form.  The subject verbalized understanding of the trial requirements.  The subject agreed to participate in the CADFEM trial and signed the informed consent.  The informed consent was obtained prior to performance of any protocol-specific procedures for the subject.  A copy of the signed informed consent was given to the subject and a copy was placed in the subject's medical record.   [place] Chalmers, Research Assistant 04/05/2019 14:42 p.m.

## 2019-04-19 ENCOUNTER — Telehealth: Payer: Self-pay | Admitting: Internal Medicine

## 2019-04-19 DIAGNOSIS — M79662 Pain in left lower leg: Secondary | ICD-10-CM

## 2019-04-19 DIAGNOSIS — M7989 Other specified soft tissue disorders: Secondary | ICD-10-CM

## 2019-04-19 NOTE — Telephone Encounter (Signed)
Consulted DOD, Dr. Irish Lack about patient's message. He recommends patient have LE duplex to rule out DVT. He also recommends patient to take verapamil or diltiazem to get HR under control. Patient agreed to plan. Will send message to scheduling to get patient in for LE duplex.

## 2019-04-19 NOTE — Telephone Encounter (Signed)
Pt c/o medication issue:  1. Name of Medication: Verapamil 120 MG  2. How are you currently taking this medication (dosage and times per day)? 1 Tablet once a day around 10 AM   3. Are you having a reaction (difficulty breathing--STAT)? Yes  4. What is your medication issue? Patient states medication Dr. Caryl Comes gave for patient to taste out is causing pain on left thigh and swelling on left ankle with tingling and throbbing in the foot that started on 04/18/19. She also started experiencing headache on 04/19/19. Please Advise.

## 2019-04-19 NOTE — Telephone Encounter (Signed)
Called patient back about her message. Patient has been taking Verapamil 120 mg for two weeks. Patient stated she was doing fine until yesterday. Patient stated her left leg is hurting, the top of thigh feels tight, the ankle is swollen, and foot is so swollen it feels like it is going to burst. Patient also complained of headache that is pulsating. Patient stated her left foot had some tingling as well. Patient stated her heart rate shot up too, heart rate is averaging around 100 and high as 126. Patient stated she did not take verapamil today and her symptoms have not improved. Will forward to Dr. Caryl Comes for advisement.

## 2019-04-20 ENCOUNTER — Ambulatory Visit: Payer: 59 | Admitting: Cardiology

## 2019-04-20 ENCOUNTER — Other Ambulatory Visit: Payer: Self-pay

## 2019-04-20 ENCOUNTER — Ambulatory Visit (HOSPITAL_COMMUNITY)
Admission: RE | Admit: 2019-04-20 | Discharge: 2019-04-20 | Disposition: A | Discharge status: Home / Self Care | Payer: BC Managed Care – PPO | Source: Ambulatory Visit | Attending: Cardiovascular Disease | Admitting: Cardiovascular Disease

## 2019-04-20 ENCOUNTER — Ambulatory Visit (INDEPENDENT_AMBULATORY_CARE_PROVIDER_SITE_OTHER): Payer: BC Managed Care – PPO | Admitting: Cardiology

## 2019-04-20 ENCOUNTER — Encounter: Payer: Self-pay | Admitting: Cardiology

## 2019-04-20 VITALS — BP 130/82 | HR 102 | Ht 66.0 in | Wt 218.0 lb

## 2019-04-20 DIAGNOSIS — M79662 Pain in left lower leg: Secondary | ICD-10-CM | POA: Diagnosis present

## 2019-04-20 DIAGNOSIS — I493 Ventricular premature depolarization: Secondary | ICD-10-CM | POA: Diagnosis not present

## 2019-04-20 DIAGNOSIS — M7989 Other specified soft tissue disorders: Secondary | ICD-10-CM | POA: Diagnosis present

## 2019-04-20 NOTE — Progress Notes (Signed)
Cardiology Office Note:    Date:  04/20/2019   ID:  Ronal Fear, DOB [birth year], MRN [id number]  PCP:  Default, Provider, MD  Cardiologist:  Thomasene Ripple, DO  Electrophysiologist:  None   Referring MD: No ref. provider found   Chief Complaint  Patient presents with  . Follow-up    History of Present Illness:    Tabitha Hill is a 43 y.o. female with a hx of RVOT PVCs, GERD, anxiety presented initially on September 8 to be evaluated for low heart rate.  At that time her EKG showed evidence of frequent PVCs therefore a ZIO monitor was placed on the patient as well as an echocardiogram was performed.  Her ZIO monitor reported 25% PVC burden, and her echocardiogram did show normal left ventricular systolic function.  Given her high burden of PVC which were RVOT configuration I recommended the patient be evaluated by EP service.  She was seen by Dr. Graciela Husbands from EP he opted more for medical therapy.  Therefore the patient was started on Cardizem 120 mg daily.  He is here today for follow-up visit.   Past Medical History:  Diagnosis Date  . Anxiety   . GERD (gastroesophageal reflux disease)   . 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 Seizures ([facility])     Past Surgical History:  Procedure Laterality Date  . Right Arm    . Right foot      Current Medications: Current Meds  Medication Sig  . diltiazem (CARDIZEM CD) 120 MG 24 hr capsule Take 120 mg by mouth daily.  Marland Kitchen lamoTRIgine (LAMICTAL) 150 MG tablet Take 150 mg by mouth 2 (two) times daily.     Allergies:   Codeine   Social History   Socioeconomic History  . Marital status: Single    Spouse name: Not on file  . Number of children: 1  . Years of education: Not on file  . Highest education level: Not on file  Occupational History  . Not on file  Social Needs  . Financial resource strain: Not on file  . Food insecurity    Worry: Not on file    Inability: Not on file  .  Transportation needs    Medical: Not on file    Non-medical: Not on file  Tobacco Use  . Smoking status: Never Smoker  . Smokeless tobacco: Never Used  Substance and Sexual Activity  . Alcohol use: No    Frequency: Never  . Drug use: No  . Sexual activity: Not on file  Lifestyle  . Physical activity    Days per week: Not on file    Minutes per session: Not on file  . Stress: Not on file  Relationships  . Social Musician on phone: Not on file    Gets together: Not on file    Attends religious service: Not on file    Active member of club or organization: Not on file    Attends meetings of clubs or organizations: Not on file    Relationship status: Not on file  Other Topics Concern  . Not on file  Social History Narrative  . Not on file     Family History: The patient's family history includes COPD in her father and paternal grandmother; Cancer in her father; Lung cancer in her paternal grandmother; Stomach cancer in her maternal grandmother.  ROS:   Review of Systems  Constitution: Negative for decreased appetite, fever and weight  gain.  HENT: Negative for congestion, ear discharge, hoarse voice and sore throat.   Eyes: Negative for discharge, redness, vision loss in right eye and visual halos.  Cardiovascular: Negative for chest pain, dyspnea on exertion, leg swelling, orthopnea and palpitations.  Respiratory: Negative for cough, hemoptysis, shortness of breath and snoring.   Endocrine: Negative for heat intolerance and polyphagia.  Hematologic/Lymphatic: Negative for bleeding problem. Does not bruise/bleed easily.  Skin: Negative for flushing, nail changes, rash and suspicious lesions.  Musculoskeletal: Negative for arthritis, joint pain, muscle cramps, myalgias, neck pain and stiffness.  Gastrointestinal: Negative for abdominal pain, bowel incontinence, diarrhea and excessive appetite.  Genitourinary: Negative for decreased libido, genital sores and  incomplete emptying.  Neurological: Negative for brief paralysis, focal weakness, headaches and loss of balance.  Psychiatric/Behavioral: Negative for altered mental status, depression and suicidal ideas.  Allergic/Immunologic: Negative for HIV exposure and persistent infections.    EKGs/Labs/Other Studies Reviewed:    The following studies were reviewed today:   EKG:  The ekg ordered today demonstrates sinus rhythm, with 91 bpm PAC and frequent premature ventricular complexes similar to prior EKG.   ZIO monitor September 2020: The patient wore the monitor for 3 days and 4 hours.   The minimal heart rate was 70 bpm. The maximal heart rate was 136 bpm. The average heart rate was 94 bpm.  Predominant rhythm was sinus. There are frequent ventricular ectopy averaging 25%/ 24-hour (including bigeminy and trigeminy). The total ventricular ectopy was 25.7% (110,0034) over the duration of the study. There are no supraventricular tachycardia. There are no pauses. There are no AV blocks. Daily diary unavailable.  Thoracic echocardiogram number 01/31/2019  1. The left ventricle has normal systolic function, with an ejection fraction of 55-60%. The cavity size was normal. Left ventricular diastolic parameters were normal. No evidence of left ventricular regional wall motion abnormalities.  2. The right ventricle has normal systolic function. The cavity was normal. There is no increase in right ventricular wall thickness.  3. The aortic root and ascending aorta are normal in size and structure.  4. No evidence of mitral valve stenosis.  5. The aortic valve is tricuspid. No stenosis of the aortic valve.  CT coronary IMPRESSION:04/05/2019 1. Coronary calcium score of 0. This was 0 percentile for age and sex matched control.  2. Normal coronary origin with right dominance.  3. No evidence of CAD.   Recent Labs: 01/19/2019: Magnesium 2.1; TSH 1.950 03/30/2019: BUN 8; Creatinine, Ser 0.80;  Potassium 4.4; Sodium 140  Recent Lipid Panel No results found for: CHOL, TRIG, HDL, CHOLHDL, VLDL, LDLCALC, LDLDIRECT  Physical Exam:    VS:  BP 130/82 (BP Location: Right Arm, Patient Position: Sitting, Cuff Size: Normal)   Pulse (!) 102   Ht 5\' 6"  (1.676 m)   Wt 218 lb (98.9 kg)   SpO2 99%   BMI 35.19 kg/m     Wt Readings from Last 3 Encounters:  04/20/19 218 lb (98.9 kg)  04/01/19 210 lb 6.4 oz (95.4 kg)  03/18/19 215 lb (97.5 kg)     GEN: Well nourished, well developed in no acute distress HEENT: Normal NECK: No JVD; No carotid bruits LYMPHATICS: No lymphadenopathy CARDIAC: S1S2 noted,RRR, no murmurs, rubs, gallops RESPIRATORY:  Clear to auscultation without rales, wheezing or rhonchi  ABDOMEN: Soft, non-tender, non-distended, +bowel sounds, no guarding. EXTREMITIES: No edema, No cyanosis, no clubbing MUSCULOSKELETAL:  No edema; No deformity  SKIN: Warm and dry NEUROLOGIC:  Alert and oriented x 3, non-focal  PSYCHIATRIC:  Normal affect, good insight  ASSESSMENT:    1. PVC (premature ventricular contraction)    PLAN:    In order of problems listed above:  1. The patient is now experiencing symptoms. She was on started on verapamil 120mg  and has now been switched to Cardizem 120mg  by Dr. Graciela HusbandsKlein Since she only started the Cardizem yesterday I have asked her to call in 3 days with feedback on her improvements. If she does not have any improvement to her heart rate and symptoms, I will add low dose beta blocker to her regimen.   She has a follow up with Dr. Graciela HusbandsKlein in feb 2021.   The patient is in agreement with the above plan. The patient left the office in stable condition.  The patient will follow up in 6 months.   Medication Adjustments/Labs and Tests Ordered: Current medicines are reviewed at length with the patient today.  Concerns regarding medicines are outlined above.  No orders of the defined types were placed in this encounter.  No orders of the defined  types were placed in this encounter.   There are no Patient Instructions on file for this visit.   Adopting a Healthy Lifestyle.  Know what a healthy weight is for you (roughly BMI <25) and aim to maintain this   Aim for 7+ servings of fruits and vegetables daily   65-80+ fluid ounces of water or unsweet tea for healthy kidneys   Limit to max 1 drink of alcohol per day; avoid smoking/tobacco   Limit animal fats in diet for cholesterol and heart health - choose grass fed whenever available   Avoid highly processed foods, and foods high in saturated/trans fats   Aim for low stress - take time to unwind and care for your mental health   Aim for 150 min of moderate intensity exercise weekly for heart health, and weights twice weekly for bone health   Aim for 7-9 hours of sleep daily   When it comes to diets, agreement about the perfect plan isnt easy to find, even among the experts. Experts at the [employer] developed an idea known as the Healthy Eating Plate. Just imagine a plate divided into logical, healthy portions.   The emphasis is on diet quality:   Load up on vegetables and fruits - one-half of your plate: Aim for color and variety, and remember that potatoes dont count.   Go for whole grains - one-quarter of your plate: Whole wheat, barley, wheat berries, quinoa, oats, brown rice, and foods made with them. If you want pasta, go with whole wheat pasta.   Protein power - one-quarter of your plate: Fish, chicken, beans, and nuts are all healthy, versatile protein sources. Limit red meat.   The diet, however, does go beyond the plate, offering a few other suggestions.   Use healthy plant oils, such as olive, canola, soy, corn, sunflower and peanut. Check the labels, and avoid partially hydrogenated oil, which have unhealthy trans fats.   If youre thirsty, drink water. Coffee and tea are good in moderation, but skip sugary drinks and limit milk and  dairy products to one or two daily servings.   The type of carbohydrate in the diet is more important than the amount. Some sources of carbohydrates, such as vegetables, fruits, whole grains, and beans-are healthier than others.   Finally, stay active  Signed, Thomasene RippleKardie Harper Vandervoort, DO  04/20/2019 4:53 PM    [facility]

## 2019-04-20 NOTE — Patient Instructions (Signed)
Medication Instructions:  Your physician recommends that you continue on your current medications as directed. Please refer to the Current Medication list given to you today.  *If you need a refill on your cardiac medications before your next appointment, please call your pharmacy*  Lab Work: None If you have labs (blood work) drawn today and your tests are completely normal, you will receive your results only by: . MyChart Message (if you have MyChart) OR . A paper copy in the mail If you have any lab test that is abnormal or we need to change your treatment, we will call you to review the results.  Testing/Procedures: None  Follow-Up: At [facility], you and your health needs are our priority.  As part of our continuing mission to provide you with exceptional heart care, we have created designated Provider Care Teams.  These Care Teams include your primary Cardiologist (physician) and Advanced Practice Providers (APPs -  Physician Assistants and Nurse Practitioners) who all work together to provide you with the care you need, when you need it.  Your next appointment:   6 month(s)  The format for your next appointment:   In Person  Provider:   Kardie Tobb, DO  Other Instructions   

## 2019-04-21 NOTE — Telephone Encounter (Signed)
Pt saw Dr Harriet Masson and had LE venous doppler on 12/8

## 2019-04-22 ENCOUNTER — Telehealth: Payer: Self-pay | Admitting: Internal Medicine

## 2019-04-22 NOTE — Telephone Encounter (Signed)
New message   STAT if HR is under 50 or over 120 (normal HR is 60-100 beats per minute)  1) What is your heart rate? 123  2) Do you have a log of your heart rate readings (document readings)?yes   3) Do you have any other symptoms?Headache, legs hurt

## 2019-04-22 NOTE — Telephone Encounter (Signed)
Pt called to report that her HR is still fluctuating but running 88-120.. it stays around 102-105.. she does not know her BP.   She feels well except for headache and sometimes she "feels it" in her chest but not really palpitations.   Pt saw Dr. Harriet Masson 04/20/19 and she mentioned possibly starting a beta blocker. She is asking what she should do next and feels the Diltiazem 120 mg is not helping.   She has cut out most of the pepsi she has been drinking and has tried to switch to water.   Will forward to Dr. Caryl Comes for review.

## 2019-04-26 NOTE — Telephone Encounter (Signed)
A GM2U The next med for her would be to try flecainide 50 mg bid  If she chooses that she will need GXT about a week later Thanks SK

## 2019-04-29 NOTE — Telephone Encounter (Signed)
Follow Up  Patient returning call. Please give patient a call back.  

## 2019-04-29 NOTE — Telephone Encounter (Signed)
Spoke with pt and advised per Dr Caryl Comes the next medication for her to try would be Flecainide 50mg  bid and if she chooses to try this medication she will need a GXT about a week after starting medication.  Pt states she is not sure what she should do.  States her heart rate is at or above 100 most of the day with a HR as high as 130.  Pt reports she does feel her palpitations more.  Pt is now asking if she should discontinue her diltiazem 120mg  daily since it seems to not be working.

## 2019-04-29 NOTE — Telephone Encounter (Signed)
Spoke with Tabitha Hill and advised per Dr Harriet Masson she should continue her Cardizem and start Flecainide.  Tabitha Hill states she will research medication and call us back if she decides she would like to start the Flecainide or make any kind of medication changes.

## 2019-04-29 NOTE — Telephone Encounter (Signed)
NO she should continue the Cardizem and start the flecainide as well. Kardie    Received above message from Dr Harriet Masson  Attempted to call pt to notify of Dr Tobb's recommendation.  Left voicemail message to contact [place] office.

## 2019-04-29 NOTE — Telephone Encounter (Signed)
Patient is returning phone call.  °

## 2019-04-30 ENCOUNTER — Telehealth: Payer: Self-pay

## 2019-04-30 NOTE — Telephone Encounter (Signed)
Returning pt's phone call.  Pt states she is still not sure that she wants to start Flecainide and requests an earlier  appointment than virtual visit she has scheduled for 06/30/2019.  Message sent to [facility], scheduler, to request earlier appointment.  Pt advised Doylene Canning will contact her with new appointment date and time.  Pt  verbalized understanding and agrees with this plan.

## 2019-05-20 NOTE — Telephone Encounter (Signed)
Spoke with pt who states she has been having an increased number of PVC's this week that she can feel.  Reports her HR has been above 100 for most of the week and has been as high as 130.  Pt reports heartburn and some right sided chest discomfort when lying down.  Pt will refill diltiazem today and has appointment with Dr Graciela Husbands on Monday, 05/24/2019.  Pt denies active CP or SOB. Pt states she does have a history of GERD but has not taken medication for this in years.  Pt advised she may want to consider discussing with her PCP since she is having increased heart burn and discomfort lying down.  Pt states she is not experiencing an increased level of stress this week.  Pt advised to continue her diltiazem as prescribed and keep appointment scheduled with Dr Graciela Husbands 05/24/2019.  Pt advised if she develops active CP or SOB she will need to go to ER for evaluation.  Pt verbalizes understanding of instructions given and agrees with plan.

## 2019-05-24 ENCOUNTER — Ambulatory Visit: Payer: BC Managed Care – PPO | Admitting: Internal Medicine

## 2019-05-24 ENCOUNTER — Other Ambulatory Visit: Payer: Self-pay

## 2019-05-24 ENCOUNTER — Encounter: Payer: Self-pay | Admitting: Internal Medicine

## 2019-05-24 VITALS — BP 120/82 | HR 100 | Ht 66.0 in | Wt 221.4 lb

## 2019-05-24 DIAGNOSIS — I493 Ventricular premature depolarization: Secondary | ICD-10-CM

## 2019-05-24 LAB — CBC
Hematocrit: 42.3 % (ref 34.0–46.6)
Hemoglobin: 14.5 g/dL (ref 11.1–15.9)
MCH: 30.6 pg (ref 26.6–33.0)
MCHC: 34.3 g/dL (ref 31.5–35.7)
MCV: 89 fL (ref 79–97)
Platelets: 343 10*3/uL (ref 150–450)
RBC: 4.74 x10E6/uL (ref 3.77–5.28)
RDW: 12 % (ref 11.7–15.4)
WBC: 8.6 10*3/uL (ref 3.4–10.8)

## 2019-05-24 MED ORDER — FLECAINIDE ACETATE 50 MG PO TABS: 50.0000 mg | ORAL_TABLET | Freq: Two times a day (BID) | ORAL | 3 refills | Status: DC

## 2019-05-24 NOTE — Progress Notes (Signed)
Patient Care Team: Default, Provider, MD as PCP - General Thomasene Ripple, DO as PCP - Cardiology (Cardiology)   HPI  Tabitha Hill is a 44 y.o. female seen in follow-up for PVCs with RVOT morphology.  Over recent months she has noted more palpitations and some more dyspnea.  No edema.  No nocturnal dyspnea.  Tried stopping her Cardizem and had pain on her left side??  TSH was normal 9/20   DATE TEST EF   9/20 Echo  55-65 %   11/20 CTA  CaScore 0   Date PVC %  9/20 20          Records and Results Reviewed    Past Medical History:  Diagnosis Date  . Anxiety   . GERD (gastroesophageal reflux disease)   . 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 Seizures (HCC)     Past Surgical History:  Procedure Laterality Date  . Right Arm    . Right foot      Current Meds  Medication Sig  . diltiazem (CARDIZEM CD) 120 MG 24 hr capsule Take 120 mg by mouth daily.  Marland Kitchen lamoTRIgine (LAMICTAL) 150 MG tablet Take 150 mg by mouth 2 (two) times daily.    Allergies  Allergen Reactions  . Codeine       Review of Systems negative except from HPI and PMH  Physical Exam BP 120/82   Pulse 100   Ht 5\' 6"  (1.676 m)   Wt 221 lb 6.4 oz (100.4 kg)   SpO2 98%   BMI 35.73 kg/m  Well developed and well nourished in no acute distress HENT normal E scleral and icterus clear Neck Supple JVP flat; carotids brisk and full Clear to ausculation  Regular rate and rhythm, no murmurs gallops or rub Soft with active bowel sounds No clubbing cyanosis  Edema Alert and oriented, grossly normal motor and sensory function Skin Warm and Dry  ECG sinus at 100 Interval 14/09/31 PVCs left bundle inferior axis morphology in a pattern of quadrigeminy*  CrCl cannot be calculated (Patient's most recent lab result is older than the maximum 21 days allowed.).   Assessment and  Plan  PVCs-RVOT  Sinus tachycardia  Side/leg pain   The  patient has frequent PVCs which are consistently symptomatic.  The dyspnea is of some concern because of the potential for PVC associated cardiomyopathy.  In any case, the Cardizem is not sufficient to suppress them.  Given her CTA and calcium score of 0 we will begin her on flecainide 50 mg twice daily.  We will plan to revisit ablation therapy versus ongoing medical therapy in about 3 months.  Her sinus tachycardia is of some note.  Do not see a CBC in the record.  We will check it today.  TSH was normal 9/20.  I have asked her to follow-up with her neurologist/gynecologist to see what her resting heart rates have been over recent years.  It may be that this is was normal.  Not sure of the mechanism of her side/leg pain which worsened considerably when trying to aburptly stop her cardiazem--no explanation, but will have her wean off the cardiazem ( by dumping half the contents of the capsules)   We spent more than 50% of our >30  min visit in face to face counseling regarding the above   Current medicines are reviewed at length with the patient today .  The patient does  have concerns regarding medicines  As above .

## 2019-05-24 NOTE — Patient Instructions (Addendum)
Medication Instructions:  Your physician has recommended you make the following change in your medication:   Stop taking Cardizem  Start Flecanide 50mg  one by mouth twice daily  *If you need a refill on your cardiac medications before your next appointment, please call your pharmacy*  Lab Work: CBC today  If you have labs (blood work) drawn today and your tests are completely normal, you will receive your results only by: MyChart Message (if you have MyChart) OR . A paper copy in the mail If you have any lab test that is abnormal or we need to change your treatment, we will call you to review the results.  Testing/Procedures: None ordered.   Follow-Up:   At [facility], you and your health needs are our priority.  As part of our continuing mission to provide you with exceptional heart care, we have created designated Provider Care Teams.  These Care Teams include your primary Cardiologist (physician) and Advanced Practice Providers (APPs -  Physician Assistants and Nurse Practitioners) who all work together to provide you with the care you need, when you need it.  Your next appointment:  Virtual visit April 8,2021 at 145pm.

## 2019-06-04 ENCOUNTER — Ambulatory Visit: Payer: BC Managed Care – PPO | Admitting: Internal Medicine

## 2019-06-30 ENCOUNTER — Telehealth: Payer: BC Managed Care – PPO | Admitting: Internal Medicine

## 2019-08-11 ENCOUNTER — Telehealth: Payer: Self-pay | Admitting: Internal Medicine

## 2019-08-11 NOTE — Telephone Encounter (Signed)
Called and spoke to patient. She states that she missed her AM and PM dose of Flecainide yesterday. She states that she has some tightness that goes down her left arm. She states that it is similar to when she abruptly stopped her diltiazem in the past. She denies having any chest pain, SOB, lightheadedness, dizziness, or any other Sx. BP 122/98 and HR 94 bpm. Patient had a Cardiac CT done that showed a calcium score of 0 and no evidence of CAD. She states that she took her Flecainide this AM. Instructed the patient to continue to take her meds as prescribed and let us know if her Sx change or worsen. She verbalized understanding and thanked me for the call.

## 2019-08-11 NOTE — Telephone Encounter (Signed)
  Pt c/o medication issue:  1. Name of Medication: flecainide (TAMBOCOR) 50 MG tablet  2. How are you currently taking this medication (dosage and times per day)? As directed, forgot to take yesterday  3. Are you having a reaction (difficulty breathing--STAT)? unsure  4. What is your medication issue? Patient forgot to take this medication yesterday. She states that she is having some left arm pain. It started in the upper arm and is now moving down her arm. She denies SOB, chest pain, dizziness and headaches. She states that her HR is 94 and does not know her BP. She is not sure if this is the cause of the pain or not. She is aware that Dr. Graciela Husbands and his nurse are both out of the office today.

## 2019-08-13 ENCOUNTER — Other Ambulatory Visit: Payer: Self-pay

## 2019-08-13 ENCOUNTER — Ambulatory Visit (HOSPITAL_COMMUNITY)
Admission: EM | Admit: 2019-08-13 | Discharge: 2019-08-13 | Disposition: A | Discharge status: Home / Self Care | Payer: BC Managed Care – PPO | Attending: Family Medicine | Admitting: Family Medicine

## 2019-08-13 ENCOUNTER — Encounter (HOSPITAL_COMMUNITY): Payer: Self-pay

## 2019-08-13 DIAGNOSIS — M5412 Radiculopathy, cervical region: Secondary | ICD-10-CM

## 2019-08-13 MED ORDER — METHYLPREDNISOLONE 4 MG PO TBPK: ORAL_TABLET | ORAL | 0 refills | Status: DC

## 2019-08-13 MED ORDER — HYDROCODONE-ACETAMINOPHEN 5-325 MG PO TABS: 1.0000 | ORAL_TABLET | ORAL | 0 refills | Status: DC | PRN

## 2019-08-13 MED ORDER — TIZANIDINE HCL 4 MG PO TABS: 4.0000 mg | ORAL_TABLET | Freq: Four times a day (QID) | ORAL | 0 refills | Status: DC | PRN

## 2019-08-13 NOTE — ED Triage Notes (Signed)
Pt c/o 9/10 sharp pain that starts in neck and travels to left shoulder, down left arm and hand. Left arm and hand are tingling. Pt denies injury. Pt has 2+ radial pulse, bilat, cap refill less than 3 sec bilat, extremities warm to touch, 5/5 grip strength bilat, pt able to move extremities.

## 2019-08-13 NOTE — ED Provider Notes (Signed)
[facility]    CSN: [id number] Arrival date & time: 08/13/19  7793      History   Chief Complaint Chief Complaint  Patient presents with  . Neck Pain    HPI Tabitha Hill is a 44 y.o. female.   HPI  Patient is here for pain in her left arm.  It started yesterday.  It goes from the base of her neck down to the thumb index and long fingers.  Some tingling in the fingers.  Normal strength and ability to use her arm. Patient moved some furniture the day before the pain but does not remember anything strenuous Denies any fall or trauma.  Denies any significant problem with her neck in the past.  She did have pulled muscles once in 2018 Patient does have a heart condition.  Irregular conduction.  She takes flecainide daily.  Under the care of cardiology.  Does not usually have any symptoms She is also on Lamictal for seizure disorder.  Seizures well controlled   Past Medical History:  Diagnosis Date  . Anxiety   . GERD (gastroesophageal reflux disease)   . 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 Seizures [facility])     Patient Active Problem List   Diagnosis Date Noted  . PVC's (premature ventricular contractions) 04/01/2019  . Left ankle injury 04/15/2014    Past Surgical History:  Procedure Laterality Date  . Right Arm    . Right foot      OB History   No obstetric history on file.      Home Medications    Prior to Admission medications   Medication Sig Start Date End Date Taking? Authorizing Provider  flecainide (TAMBOCOR) 50 MG tablet Take 1 tablet (50 mg total) by mouth 2 (two) times daily. 05/24/19   Duke Salvia, MD  HYDROcodone-acetaminophen (NORCO/VICODIN) 5-325 MG tablet Take 1-2 tablets by mouth every 4 (four) hours as needed for severe pain. 08/13/19   Eustace Moore, MD  lamoTRIgine (LAMICTAL) 150 MG tablet Take 150 mg by mouth 2 (two) times daily. 09/05/16   [provider]    methylPREDNISolone (MEDROL DOSEPAK) 4 MG TBPK tablet tad 08/13/19   Eustace Moore, MD  tiZANidine (ZANAFLEX) 4 MG tablet Take 1-2 tablets (4-8 mg total) by mouth every 6 (six) hours as needed for muscle spasms. 08/13/19   Eustace Moore, MD    Family History Family History  Problem Relation Age of Onset  . Cancer Father   . COPD Father   . Stomach cancer Maternal Grandmother   . COPD Paternal Grandmother   . Lung cancer Paternal Grandmother     Social History Social History   Tobacco Use  . Smoking status: Never Smoker  . Smokeless tobacco: Never Used  Substance Use Topics  . Alcohol use: No  . Drug use: No     Allergies   Codeine   Review of Systems Review of Systems  Musculoskeletal: Negative for neck pain and neck stiffness.  Neurological: Positive for numbness.     Physical Exam Triage Vital Signs ED Triage Vitals  Enc Vitals Group     BP 08/13/19 0828 129/88     Pulse Rate 08/13/19 0828 84     Resp 08/13/19 0828 18     Temp 08/13/19 0828 98.7 F (37.1 C)     Temp Source 08/13/19 0828 Oral     SpO2 08/13/19 0828 99 %     Weight 08/13/19  0829 228 lb (103.4 kg)     Height 08/13/19 0829 5\' 6"  (1.676 m)     Head Circumference --      Peak Flow --      Pain Score 08/13/19 0829 9     Pain Loc --      Pain Edu? --      Excl. in [place]? --    No data found.  Updated Vital Signs BP 129/88   Pulse 84   Temp 98.7 F (37.1 C) (Oral)   Resp 18   Ht 5\' 6"  (1.676 m)   Wt 103.4 kg   SpO2 99%   BMI 36.80 kg/m     Physical Exam Constitutional:      General: She is not in acute distress.    Appearance: Normal appearance. She is well-developed.  HENT:     Head: Normocephalic and atraumatic.     Mouth/Throat:     Comments: Mask is in place Eyes:     Conjunctiva/sclera: Conjunctivae normal.     Pupils: Pupils are equal, round, and reactive to light.  Neck:   Cardiovascular:     Rate and Rhythm: Normal rate.  Pulmonary:     Effort: Pulmonary  effort is normal. No respiratory distress.  Musculoskeletal:        General: Normal range of motion.     Cervical back: Normal range of motion. Tenderness present.  Skin:    General: Skin is warm and dry.  Neurological:     Mental Status: She is alert.     Sensory: No sensory deficit.     Motor: No weakness.     Coordination: Coordination normal.     Deep Tendon Reflexes: Reflexes normal.  Psychiatric:        Mood and Affect: Mood normal.        Behavior: Behavior normal.      UC Treatments / Results  Labs (all labs ordered are listed, but only abnormal results are displayed) Labs Reviewed - No data to display  EKG   Radiology No results found.  Procedures Procedures (including critical care time)  Medications Ordered in UC Medications - No data to display  Initial Impression / Assessment and Plan / UC Course  I have reviewed the triage vital signs and the nursing notes.  Pertinent labs & imaging results that were available during my care of the patient were reviewed by me and considered in my medical decision making (see chart for details).     Discussed cervical radiculopathy.  Nerve root inflammation.  Treatment.  Expectation of recovery May need physical therapy if fails to improve May need x-rays or imaging if fails to improve Final Clinical Impressions(s) / UC Diagnoses   Final diagnoses:  Cervical radiculopathy     Discharge Instructions     Ice or heat to area Activity as tolerated Take the medrol ( steroid) as directed Take all of day one today Take the muscle relaxer and pain medicine as needed Call or return if not improving in a few days   ED Prescriptions    Medication Sig Dispense Auth. Provider   methylPREDNISolone (MEDROL DOSEPAK) 4 MG TBPK tablet tad 21 tablet Raylene Everts, MD   tiZANidine (ZANAFLEX) 4 MG tablet Take 1-2 tablets (4-8 mg total) by mouth every 6 (six) hours as needed for muscle spasms. 21 tablet Raylene Everts, MD   HYDROcodone-acetaminophen (NORCO/VICODIN) 5-325 MG tablet Take 1-2 tablets by mouth every 4 (four) hours as needed  for severe pain. 12 tablet Eustace Moore, MD     I have reviewed the PDMP during this encounter.   Eustace Moore, MD 08/13/19 [phone]

## 2019-08-13 NOTE — Discharge Instructions (Addendum)
Ice or heat to area Activity as tolerated Take the medrol ( steroid) as directed Take all of day one today Take the muscle relaxer and pain medicine as needed Call or return if not improving in a few days

## 2019-08-16 NOTE — Telephone Encounter (Signed)
noted 

## 2019-08-19 ENCOUNTER — Other Ambulatory Visit: Payer: Self-pay

## 2019-08-19 ENCOUNTER — Encounter: Payer: Self-pay | Admitting: *Deleted

## 2019-08-19 ENCOUNTER — Telehealth (INDEPENDENT_AMBULATORY_CARE_PROVIDER_SITE_OTHER): Payer: BC Managed Care – PPO | Admitting: Internal Medicine

## 2019-08-19 VITALS — HR 92 | Ht 66.0 in | Wt 224.0 lb

## 2019-08-19 DIAGNOSIS — I493 Ventricular premature depolarization: Secondary | ICD-10-CM | POA: Diagnosis not present

## 2019-08-19 NOTE — Progress Notes (Signed)
Patient ID: Tabitha Hill, female   DOB: 09-05-1975, 44 y.o.   MRN: [id number] Patient enrolled for [employer] to mail a 3 day ZIO XT long term holter monitor to her home.

## 2019-08-19 NOTE — Patient Instructions (Addendum)
Medication Instructions:  Your physician recommends that you continue on your current medications as directed. Please refer to the Current Medication list given to you today.  *If you need a refill on your cardiac medications before your next appointment, please call your pharmacy*   Lab Work: None ordered.  If you have labs (blood work) drawn today and your tests are completely normal, you will receive your results only by: Marland Kitchen MyChart Message (if you have MyChart) OR . A paper copy in the mail If you have any lab test that is abnormal or we need to change your treatment, we will call you to review the results.   Testing/Procedures: Christena Deem- Long Term Monitor Instructions   Your physician has requested you wear your ZIO patch monitor______3_days.   This is a single patch monitor.  [employer] supplies one patch monitor per enrollment.  Additional stickers are not available.   Please do not apply patch if you will be having a Nuclear Stress Test, Echocardiogram, Cardiac CT, MRI, or Chest Xray during the time frame you would be wearing the monitor. The patch cannot be worn during these tests.  You cannot remove and re-apply the ZIO XT patch monitor.   Your ZIO patch monitor will be sent USPS Priority mail from [facility] directly to your home address. The monitor may also be mailed to a PO BOX if home delivery is not available.   It may take 3-5 days to receive your monitor after you have been enrolled.   Once you have received you monitor, please review enclosed instructions.  Your monitor has already been registered assigning a specific monitor serial # to you.   Applying the monitor   Shave hair from upper left chest.   Hold abrader disc by orange tab.  Rub abrader in 40 strokes over left upper chest as indicated in your monitor instructions.   Clean area with 4 enclosed alcohol pads .  Use all pads to assure are is cleaned thoroughly.  Let dry.   Apply patch as indicated in  monitor instructions.  Patch will be place under collarbone on left side of chest with arrow pointing upward.   Rub patch adhesive wings for 2 minutes.Remove white label marked "1".  Remove white label marked "2".  Rub patch adhesive wings for 2 additional minutes.   While looking in a mirror, press and release button in center of patch.  A small green light will flash 3-4 times .  This will be your only indicator the monitor has been turned on.     Do not shower for the first 24 hours.  You may shower after the first 24 hours.   Press button if you feel a symptom. You will hear a small click.  Record Date, Time and Symptom in the Patient Log Book.   When you are ready to remove patch, follow instructions on last 2 pages of Patient Log Book.  Stick patch monitor onto last page of Patient Log Book.   Place Patient Log Book in [place] box.  Use locking tab on box and tape box closed securely.  The [employer] and [employer] has [employer] on it.  Please place in mailbox as soon as possible.  Your physician should have your test results approximately 7 days after the monitor has been mailed back to [facility].   Call [facility] Customer Care at [phone] if you have questions regarding your ZIO XT patch monitor.  Call them immediately if you see  an orange light blinking on your monitor.   If your monitor falls off in less than 4 days contact our Monitor department at [phone].  If your monitor becomes loose or falls off after 4 days call [employer] at [phone] for suggestions on securing your monitor.     Follow-Up: At [facility], you and your health needs are our priority.  As part of our continuing mission to provide you with exceptional heart care, we have created designated Provider Care Teams.  These Care Teams include your primary Cardiologist (physician) and Advanced Practice Providers (APPs -  Physician Assistants and Nurse Practitioners) who all work together to provide  you with the care you need, when you need it.  We recommend signing up for the patient portal called "MyChart".  Sign up information is provided on this After Visit Summary.  MyChart is used to connect with patients for Virtual Visits (Telemedicine).  Patients are able to view lab/test results, encounter notes, upcoming appointments, etc.  Non-urgent messages can be sent to your provider as well.   To learn more about what you can do with MyChart, go to [email or web address].    Your next appointment:   4 month(s)You will receive a reminder letter in the mail to schedule appointment.  The format for your next appointment:   In Person  Provider:   You may see Dr Caryl Comes or one of the following Advanced Practice Providers on your designated Care Team:    Chanetta Marshall, NP  Tommye Standard, PA-C  Legrand Como "Thayne" Spooner, [place]

## 2019-08-19 NOTE — Progress Notes (Signed)
Electrophysiology TeleHealth Note   Due to national recommendations of social distancing due to COVID 19, an audio/video telehealth visit is felt to be most appropriate for this patient at this time.  See MyChart message from today for the patient's consent to telehealth for [facility].   Date:  08/19/2019   ID:  Tabitha Hill, DOB [birth year], MRN [id number]  Location: patient's home  Provider location: [address], [place]  Evaluation Performed: Follow-up visit  PCP:  Patient, No Pcp Per  Cardiologist:     Electrophysiologist:  SK   Chief Complaint:  PVCs  History of Present Illness:    Tabitha Hill is a 44 y.o. female who presents via audio/video conferencing for a telehealth visit today.  Since last being seen in our clinic for PVCs-RVOT for which she was started 1/21 on flecainide the patient reports doing better with no " bradycardia" as manifesting PVC  HR aobut 100   Exercise intolerance but not much worse-- ? Out of shape No chest pain, LE edema but some hand swelling  Also pain in hand and arm-- seen at urgent care with Dx of nerve  Also swelling in the same hand     DATE TEST EF   9/20 Echo 55-65%   11/20 CTA  CaScore 0   Date PVC %  9/20 20        The patient denies symptoms of fevers, chills, cough, or new SOB worrisome for COVID 19.   Past Medical History:  Diagnosis Date  . Anxiety   . GERD (gastroesophageal reflux disease)   . 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 Seizures ([place])     Past Surgical History:  Procedure Laterality Date  . Right Arm    . Right foot      Current Outpatient Medications  Medication Sig Dispense Refill  . flecainide (TAMBOCOR) 50 MG tablet Take 1 tablet (50 mg total) by mouth 2 (two) times daily. 180 tablet 3  . lamoTRIgine (LAMICTAL) 150 MG tablet Take 150 mg by mouth 2 (two) times daily.    Marland Kitchen HYDROcodone-acetaminophen (NORCO/VICODIN)  5-325 MG tablet Take 1-2 tablets by mouth every 4 (four) hours as needed for severe pain. (Patient not taking: Reported on 08/19/2019) 12 tablet 0  . methylPREDNISolone (MEDROL DOSEPAK) 4 MG TBPK tablet tad (Patient not taking: Reported on 08/19/2019) 21 tablet 0  . tiZANidine (ZANAFLEX) 4 MG tablet Take 1-2 tablets (4-8 mg total) by mouth every 6 (six) hours as needed for muscle spasms. (Patient not taking: Reported on 08/19/2019) 21 tablet 0   No current facility-administered medications for this visit.    Allergies:   Codeine   Social History:  The patient  reports that she has never smoked. She has never used smokeless tobacco. She reports that she does not drink alcohol or use drugs.   Family History:  The patient's   family history includes COPD in her father and paternal grandmother; Cancer in her father; Lung cancer in her paternal grandmother; Stomach cancer in her maternal grandmother.   ROS:  Please see the history of present illness.   All other systems are personally reviewed and negative.    Exam:    Vital Signs:  Pulse 92   Ht 5\' 6"  (1.676 m)   Wt 224 lb (101.6 kg)   BMI 36.15 kg/m     Labs/Other Tests and Data Reviewed:    Recent Labs: 01/19/2019: Magnesium 2.1; TSH 1.950  03/30/2019: BUN 8; Creatinine, Ser 0.80; Potassium 4.4; Sodium 140 05/24/2019: Hemoglobin 14.5; Platelets 343   Wt Readings from Last 3 Encounters:  08/19/19 224 lb (101.6 kg)  08/13/19 228 lb (103.4 kg)  05/24/19 221 lb 6.4 oz (100.4 kg)     Other studies personally reviewed: Additional studies/ records that were reviewed today include:As above     ASSESSMENT & PLAN:    Sinus    PVCs    Flecainide is controlling PVCs  Exercise intolerance may be related to sinus tachycardia so we discussed betablockers as a trial   COVID 19 screen The patient denies symptoms of COVID 19 at this time.  The importance of social distancing was discussed today.  Follow-up:  4 month    Current medicines  are reviewed at length with the patient today.   The patient does not have concerns regarding her medicines.  The following changes were made today:  none  Labs/ tests ordered today include: ZIO x 3 d for PVCs No orders of the defined types were placed in this encounter.     Patient Risk:  after full review of this patients clinical status, I feel that they are at moderate  risk at this time.  Today, I have spent 18  minutes with the patient with telehealth technology discussing the above.  Signed, Sherryl Manges, MD  08/19/2019 2:15 PM     [facility] [address] [place] [address] [phone] (office) [phone] (fax)

## 2019-08-31 ENCOUNTER — Ambulatory Visit (INDEPENDENT_AMBULATORY_CARE_PROVIDER_SITE_OTHER): Payer: BC Managed Care – PPO

## 2019-08-31 DIAGNOSIS — I493 Ventricular premature depolarization: Secondary | ICD-10-CM

## 2019-12-26 NOTE — Progress Notes (Signed)
Cardiology Office Note Date:  12/28/2019  Patient ID:  Tabitha Hill, DOB October 19, 1975, MRN [id number] PCP:  Patient, No Pcp Per  Cardiologist:  Dr. Servando Salina EP: Dr. Graciela Husbands     Chief Complaint: *scheduled f/u  History of Present Illness: Tabitha Hill is a 44 y.o. female with history of GERD, anxiety, seizure d/o, PVCs (RVOT)   She comes in today to be seen for Dr. Graciela Husbands, last seen by him via tele medicine 08/19/2019. She was doing better with no reports of bradycardia, reporting HR about 100. She mentioned some exercise intolerance he thought may be related to ST and discussed starting BB as a trial, planned for 3 day monitor for PVC burden (I do not see that BB was started) Monitor noted marked reduction in her PVCs from 20 >> 2%   TODAY She feels like she has had improvement with the Flecainide No CP, palpitations, SOB No dizzy spells , near syncope or syncope. We discussed Dr. Odessa Fleming mention of addition of BB, she reports that in discussion with him she had hhoped to avoid an additional medicine and they decided not to add it.  She continues to find her HR generally 90's, sometimes she thinks perhaps erroneously rates to 120's without symptoms.  Mentions her job can be quite stressful, and ramping up again in [employer] with resurgence of COVID    AAD Hx Flecainide jan 2021  Past Medical History:  Diagnosis Date  . Anxiety   . GERD (gastroesophageal reflux disease)   . 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 Seizures (HCC)     Past Surgical History:  Procedure Laterality Date  . Right Arm    . Right foot      Current Outpatient Medications  Medication Sig Dispense Refill  . flecainide (TAMBOCOR) 50 MG tablet Take 1 tablet (50 mg total) by mouth 2 (two) times daily. 180 tablet 3  . lamoTRIgine (LAMICTAL) 150 MG tablet Take 150 mg by mouth 2 (two) times daily.     No current facility-administered medications for this  visit.    Allergies:   Codeine   Social History:  The patient  reports that she has never smoked. She has never used smokeless tobacco. She reports that she does not drink alcohol and does not use drugs.   Family History:  The patient's family history includes COPD in her father and paternal grandmother; Cancer in her father; Lung cancer in her paternal grandmother; Stomach cancer in her maternal grandmother.  ROS:  Please see the history of present illness.  All other systems are reviewed and otherwise negative.   PHYSICAL EXAM:  VS:  BP 110/62   Pulse 87   Ht 5\' 5"  (1.651 m)   Wt 238 lb (108 kg)   BMI 39.61 kg/m  BMI: Body mass index is 39.61 kg/m. Well nourished, well developed, in no acute distress  HEENT: normocephalic, atraumatic  Neck: no JVD, carotid bruits or masses Cardiac:   RRR; no significant murmurs, no rubs, or gallops Lungs:  CTA b/l, no wheezing, rhonchi or rales  Abd: soft, nontender, obese MS: no deformity or atrophy Ext: no edema  Skin: warm and dry, no rash Neuro:  No gross deficits appreciated Psych: euthymic mood, full affect    EKG:  Done today sand reviewed by myself hows  SR 87bpm, normal intervals   May 2021: Monitor Duration: 3d  Findings HR  avg 91  Min 58-Max 148  PVCs 2.2% PACs <1%  Recommendations Much improved with 10x reduction in PVCs 20>2%   04/05/2019: Coronary CT IMPRESSION: 1. Coronary calcium score of 0. This was 0 percentile for age and sex matched control.  2. Normal coronary origin with right dominance.  3. No evidence of CAD.   01/21/2019: TTE IMPRESSIONS  1. The left ventricle has normal systolic function, with an ejection  fraction of 55-60%. The cavity size was normal. Left ventricular diastolic  parameters were normal. No evidence of left ventricular regional wall  motion abnormalities.  2. The right ventricle has normal systolic function. The cavity was  normal. There is no increase in right  ventricular wall thickness.  3. The aortic root and ascending aorta are normal in size and structure.  4. No evidence of mitral valve stenosis.  5. The aortic valve is tricuspid. No stenosis of the aortic valve.     Recent Labs: 01/19/2019: Magnesium 2.1; TSH 1.950 03/30/2019: BUN 8; Creatinine, Ser 0.80; Potassium 4.4; Sodium 140 05/24/2019: Hemoglobin 14.5; Platelets 343  No results found for requested labs within last 8760 hours.   CrCl cannot be calculated (Patient's most recent lab result is older than the maximum 21 days allowed.).   Wt Readings from Last 3 Encounters:  12/28/19 238 lb (108 kg)  08/19/19 224 lb (101.6 kg)  08/13/19 228 lb (103.4 kg)     Other studies reviewed: Additional studies/records reviewed today include: summarized above  ASSESSMENT AND PLAN:  1. PVCs     Flecainide     Much improved PVC burden by her monitor, none noted today on EKG or exam, she is feeling better     Stable intervals  Discussed exercise and weight loss I would like her to get an ETT on flecainide prior to starting exercise. She has no h/o atrial arrhythmias, not on BB     Disposition: F/u with ETT result, I will d/w Dr. Graciela Husbands his thoughts on BB for her.  Otherwise see her back in 50mo, sooner if needed  Current medicines are reviewed at length with the patient today.  The patient did not have any concerns regarding medicines.  Norma Fredrickson, PA-C 12/28/2019 9:31 AM     [facility] [address] [place] [address] [phone] (office)  [phone] (fax)

## 2019-12-28 ENCOUNTER — Ambulatory Visit: Payer: BC Managed Care – PPO | Admitting: Physician Assistant

## 2019-12-28 ENCOUNTER — Other Ambulatory Visit: Payer: Self-pay

## 2019-12-28 ENCOUNTER — Encounter: Payer: Self-pay | Admitting: *Deleted

## 2019-12-28 VITALS — BP 110/62 | HR 87 | Ht 65.0 in | Wt 238.0 lb

## 2019-12-28 DIAGNOSIS — I493 Ventricular premature depolarization: Secondary | ICD-10-CM

## 2019-12-28 DIAGNOSIS — Z5181 Encounter for therapeutic drug level monitoring: Secondary | ICD-10-CM | POA: Diagnosis not present

## 2019-12-28 DIAGNOSIS — Z79899 Other long term (current) drug therapy: Secondary | ICD-10-CM

## 2019-12-28 NOTE — Patient Instructions (Signed)
Medication Instructions:  Your physician recommends that you continue on your current medications as directed. Please refer to the Current Medication list given to you today.  *If you need a refill on your cardiac medications before your next appointment, please call your pharmacy*   Lab Work:  WILL NEED COVID SCREENING   If you have labs (blood work) drawn today and your tests are completely normal, you will receive your results only by:  MyChart Message (if you have MyChart) OR  A paper copy in the mail If you have any lab test that is abnormal or we need to change your treatment, we will call you to review the results.   Testing/Procedures: Your physician has requested that you have an exercise tolerance test. For further information please visit [email or web address] Please also follow instruction sheet, as given.    Follow-Up: At [facility], you and your health needs are our priority.  As part of our continuing mission to provide you with exceptional heart care, we have created designated Provider Care Teams.  These Care Teams include your primary Cardiologist (physician) and Advanced Practice Providers (APPs -  Physician Assistants and Nurse Practitioners) who all work together to provide you with the care you need, when you need it.  We recommend signing up for the patient portal called "MyChart".  Sign up information is provided on this After Visit Summary.  MyChart is used to connect with patients for Virtual Visits (Telemedicine).  Patients are able to view lab/test results, encounter notes, upcoming appointments, etc.  Non-urgent messages can be sent to your provider as well.   To learn more about what you can do with MyChart, go to [email or web address].    Your next appointment:   4 month(s)  The format for your next appointment:   In Person  Provider:   You may see Dr. Graciela Husbands  or one of the following Advanced Practice Providers on your designated Care Team:     Gypsy Balsam, NP  Francis Dowse, PA-C  Casimiro Needle "Otilio Saber, [place]    Other Instructions

## 2020-01-07 ENCOUNTER — Other Ambulatory Visit (HOSPITAL_COMMUNITY)
Admission: RE | Admit: 2020-01-07 | Discharge: 2020-01-07 | Disposition: A | Discharge status: Home / Self Care | Payer: BC Managed Care – PPO | Source: Ambulatory Visit | Attending: Physician Assistant | Admitting: Physician Assistant

## 2020-01-07 DIAGNOSIS — Z01812 Encounter for preprocedural laboratory examination: Secondary | ICD-10-CM | POA: Insufficient documentation

## 2020-01-07 DIAGNOSIS — Z20822 Contact with and (suspected) exposure to covid-19: Secondary | ICD-10-CM | POA: Insufficient documentation

## 2020-01-07 LAB — SARS CORONAVIRUS 2 (TAT 6-24 HRS): SARS Coronavirus 2: NEGATIVE

## 2020-01-11 ENCOUNTER — Other Ambulatory Visit: Payer: Self-pay | Admitting: Physician Assistant

## 2020-01-11 ENCOUNTER — Ambulatory Visit (INDEPENDENT_AMBULATORY_CARE_PROVIDER_SITE_OTHER): Payer: BC Managed Care – PPO

## 2020-01-11 ENCOUNTER — Other Ambulatory Visit: Payer: Self-pay

## 2020-01-11 DIAGNOSIS — Z5181 Encounter for therapeutic drug level monitoring: Secondary | ICD-10-CM | POA: Diagnosis not present

## 2020-01-11 DIAGNOSIS — Z79899 Other long term (current) drug therapy: Secondary | ICD-10-CM

## 2020-01-11 DIAGNOSIS — I493 Ventricular premature depolarization: Secondary | ICD-10-CM

## 2020-01-11 LAB — EXERCISE TOLERANCE TEST
Estimated workload: 10.4 METS
Exercise duration (min): 8 min
Exercise duration (sec): 1 s
MPHR: 176 {beats}/min
Peak HR: 169 {beats}/min
Percent HR: 96 %
RPE: 14
Rest HR: 93 {beats}/min

## 2020-01-30 ENCOUNTER — Ambulatory Visit (HOSPITAL_COMMUNITY)
Admission: EM | Admit: 2020-01-30 | Discharge: 2020-01-30 | Disposition: A | Discharge status: Home / Self Care | Payer: BC Managed Care – PPO | Attending: Family Medicine | Admitting: Family Medicine

## 2020-01-30 ENCOUNTER — Other Ambulatory Visit: Payer: Self-pay

## 2020-01-30 DIAGNOSIS — M79641 Pain in right hand: Secondary | ICD-10-CM

## 2020-01-30 DIAGNOSIS — S61431A Puncture wound without foreign body of right hand, initial encounter: Secondary | ICD-10-CM | POA: Diagnosis not present

## 2020-01-30 MED ORDER — CEPHALEXIN 500 MG PO CAPS: 500.0000 mg | ORAL_CAPSULE | Freq: Two times a day (BID) | ORAL | 0 refills | Status: DC

## 2020-01-30 MED ORDER — CEPHALEXIN 500 MG PO CAPS: 500.0000 mg | ORAL_CAPSULE | Freq: Two times a day (BID) | ORAL | 0 refills | Status: AC

## 2020-01-30 NOTE — ED Provider Notes (Signed)
[facility]    CSN: [id number] Arrival date & time: 01/30/20  1711      History   Chief Complaint Chief Complaint  Patient presents with  . Hand Pain    HPI Tabitha Hill is a 44 y.o. female.   Reports that she accidentally stabbed her right palm with a skewer while she was making dinner earlier today.  Reports that she does not think that any of the what is left in her hand.  Is concerned about nerve damage.  Has not taken any medication for this.  Reports that the area did not bleed very much.  There are no aggravating or alleviating factors.  Denies headache, cough, shortness of breath, nausea, vomiting, erythema, swelling, drainage, rash, fever, other symptoms.  ROS per HPI  The history is provided by the patient.    Past Medical History:  Diagnosis Date  . Anxiety   . GERD (gastroesophageal reflux disease)   . 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 Seizures [facility])     Patient Active Problem List   Diagnosis Date Noted  . PVC's (premature ventricular contractions) 04/01/2019  . Left ankle injury 04/15/2014    Past Surgical History:  Procedure Laterality Date  . Right Arm    . Right foot      OB History   No obstetric history on file.      Home Medications    Prior to Admission medications   Medication Sig Start Date End Date Taking? Authorizing Provider  cephALEXin (KEFLEX) 500 MG capsule Take 1 capsule (500 mg total) by mouth 2 (two) times daily for 7 days. 01/30/20 02/06/20  Moshe Cipro, NP  flecainide (TAMBOCOR) 50 MG tablet Take 1 tablet (50 mg total) by mouth 2 (two) times daily. 05/24/19   Duke Salvia, MD  lamoTRIgine (LAMICTAL) 150 MG tablet Take 150 mg by mouth 2 (two) times daily. 09/05/16   [provider]    Family History Family History  Problem Relation Age of Onset  . Cancer Father   . COPD Father   . Stomach cancer Maternal Grandmother   . COPD Paternal  Grandmother   . Lung cancer Paternal Grandmother     Social History Social History   Tobacco Use  . Smoking status: Never Smoker  . Smokeless tobacco: Never Used  Substance Use Topics  . Alcohol use: No  . Drug use: No     Allergies   Codeine   Review of Systems Review of Systems   Physical Exam Triage Vital Signs ED Triage Vitals  Enc Vitals Group     BP 01/30/20 1803 136/88     Pulse Rate 01/30/20 1803 95     Resp 01/30/20 1803 16     Temp 01/30/20 1803 99.2 F (37.3 C)     Temp Source 01/30/20 1803 Oral     SpO2 01/30/20 1803 98 %     Weight --      Height --      Head Circumference --      Peak Flow --      Pain Score 01/30/20 1802 5     Pain Loc --      Pain Edu? --      Excl. in GC? --    No data found.  Updated Vital Signs BP 136/88 (BP Location: Right Arm)   Pulse 95   Temp 99.2 F (37.3 C) (Oral)   Resp 16   SpO2 98%  Visual Acuity Right Eye Distance:   Left Eye Distance:   Bilateral Distance:    Right Eye Near:   Left Eye Near:    Bilateral Near:     Physical Exam Vitals and nursing note reviewed.  Constitutional:      General: She is not in acute distress.    Appearance: She is well-developed.  HENT:     Head: Normocephalic and atraumatic.  Eyes:     Conjunctiva/sclera: Conjunctivae normal.  Cardiovascular:     Rate and Rhythm: Normal rate and regular rhythm.     Heart sounds: No murmur heard.   Pulmonary:     Effort: Pulmonary effort is normal. No respiratory distress.     Breath sounds: Normal breath sounds.  Abdominal:     Palpations: Abdomen is soft.     Tenderness: There is no abdominal tenderness.  Musculoskeletal:     Cervical back: Neck supple.  Skin:    General: Skin is warm and dry.     Findings: Wound present.     Comments: Puncture wound to right palm in between second and third fingers.  Full range of motion, nontender, no erythema noted, no bleeding, no drainage.  Neurological:     Mental Status: She is  alert.      UC Treatments / Results  Labs (all labs ordered are listed, but only abnormal results are displayed) Labs Reviewed - No data to display  EKG   Radiology No results found.  Procedures Procedures (including critical care time)  Medications Ordered in UC Medications - No data to display  Initial Impression / Assessment and Plan / UC Course  I have reviewed the triage vital signs and the nursing notes.  Pertinent labs & imaging results that were available during my care of the patient were reviewed by me and considered in my medical decision making (see chart for details).     Right hand pain Puncture wound  Presents today with puncture wound to the right palm in between the second and third fingers from a wooden skewer that she was using to make dinner Brought skewer into the office and it appears to be intact Area is nontender, nonerythematous, there is no drainage, bleeding is controlled Prescribed Keflex in case of infection Discussed on when to take this medication Follow-up with primary care with this office as needed Follow-up with the ER for red streaking up the hand or arm, loss or change in sensation, other concerning symptoms  Final Clinical Impressions(s) / UC Diagnoses   Final diagnoses:  Right hand pain  Puncture wound of right hand, foreign body presence unspecified, initial encounter     Discharge Instructions     I have sent in Keflex for you to take twice a day for 7 days in case of infection  If you notice increased, heat, swelling, drainage from the area  Follow up as needed    ED Prescriptions    Medication Sig Dispense Auth. Provider   cephALEXin (KEFLEX) 500 MG capsule  (Status: Discontinued) Take 1 capsule (500 mg total) by mouth 2 (two) times daily for 7 days. 14 capsule Moshe Cipro, NP   cephALEXin (KEFLEX) 500 MG capsule Take 1 capsule (500 mg total) by mouth 2 (two) times daily for 7 days. 14 capsule Moshe Cipro, NP     PDMP not reviewed this encounter.   Moshe Cipro, NP 01/30/20 1842

## 2020-01-30 NOTE — ED Triage Notes (Signed)
Pt presents with hand injury after a wooden skewer stuck into hand.

## 2020-01-30 NOTE — Discharge Instructions (Signed)
I have sent in Keflex for you to take twice a day for 7 days in case of infection  If you notice increased, heat, swelling, drainage from the area  Follow up as needed

## 2020-05-07 NOTE — Progress Notes (Signed)
Cardiology Office Note Date:  05/09/2020  Patient ID:  Tabitha Hill, DOB 25-Nov-1975, MRN [id number] PCP:  Patient, No Pcp Per  Cardiologist:  Dr. Servando Salina EP: Dr. Graciela Husbands     Chief Complaint:  scheduled f/u  History of Present Illness: Tabitha Hill is a 44 y.o. female with history of GERD, anxiety, seizure d/o, PVCs (RVOT)   She comes in today to be seen for Dr. Graciela Husbands, last seen by him via tele medicine 08/19/2019. She was doing better with no reports of bradycardia, reporting HR about 100. She mentioned some exercise intolerance he thought may be related to ST and discussed starting BB as a trial, planned for 3 day monitor for PVC burden (I do not see that BB was started) Monitor noted marked reduction in her PVCs from 20 >> 2%   I saw her 12/28/19 She feels like she has had improvement with the Flecainide No CP, palpitations, SOB No dizzy spells , near syncope or syncope. We discussed Dr. Odessa Fleming mention of addition of BB, she reports that in discussion with him she had hhoped to avoid an additional medicine and they decided not to add it. She continues to find her HR generally 90's, sometimes she thinks perhaps erroneously rates to 120's without symptoms.  Mentions her job can be quite stressful, and ramping up again in [employer] with resurgence of COVID Discussed exercise, though discussed getting ETT done 1st and 3mo follow up   TODAY She is doing well She and her son have taken up tennis lessons and is really enjoying it. No exertional intolerances, gets winded though appropriate for level of exercise. No CP, no palpitations or cardiac awareness. No near syncope or syncope.   AAD Hx Flecainide jan 2021  Past Medical History:  Diagnosis Date  . Anxiety   . GERD (gastroesophageal reflux disease)   . 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 Seizures (HCC)     Past Surgical History:  Procedure Laterality Date  . Right  Arm    . Right foot      Current Outpatient Medications  Medication Sig Dispense Refill  . lamoTRIgine (LAMICTAL) 150 MG tablet Take 150 mg by mouth 2 (two) times daily.    . flecainide (TAMBOCOR) 50 MG tablet Take 1 tablet (50 mg total) by mouth 2 (two) times daily. 180 tablet 3   No current facility-administered medications for this visit.    Allergies:   Codeine   Social History:  The patient  reports that she has never smoked. She has never used smokeless tobacco. She reports that she does not drink alcohol and does not use drugs.   Family History:  The patient's family history includes COPD in her father and paternal grandmother; Cancer in her father; Lung cancer in her paternal grandmother; Stomach cancer in her maternal grandmother.  ROS:  Please see the history of present illness.  All other systems are reviewed and otherwise negative.   PHYSICAL EXAM:  VS:  BP 118/70   Pulse 97   Ht 5\' 5"  (1.651 m)   Wt 244 lb (110.7 kg)   SpO2 98%   BMI 40.60 kg/m  BMI: Body mass index is 40.6 kg/m. Well nourished, well developed, in no acute distress  HEENT: normocephalic, atraumatic  Neck: no JVD, carotid bruits or masses Cardiac:   RRR; no significant murmurs, no rubs, or gallops Lungs:  CTA b/l, no wheezing, rhonchi or rales  Abd: soft, nontender, obese MS: no deformity  or atrophy Ext: no edema  Skin: warm and dry, no rash Neuro:  No gross deficits appreciated Psych: euthymic mood, full affect    EKG:  Done today sand reviewed by myself hows  SR 94bpm, stable intervals   01/11/20: ETT  Blood pressure demonstrated a normal response to exercise.  There was no ST segment deviation noted during stress.   Normal ETT Normal hemodynamic response No significant arrhythmias    May 2021: Monitor Duration: 3d  Findings HR  avg 91  Min 58-Max 148  PVCs 2.2% PACs <1%   Recommendations Much improved with 10x reduction in PVCs 20>2%   04/05/2019: Coronary  CT IMPRESSION: 1. Coronary calcium score of 0. This was 0 percentile for age and sex matched control.  2. Normal coronary origin with right dominance.  3. No evidence of CAD.   01/21/2019: TTE IMPRESSIONS  1. The left ventricle has normal systolic function, with an ejection  fraction of 55-60%. The cavity size was normal. Left ventricular diastolic  parameters were normal. No evidence of left ventricular regional wall  motion abnormalities.  2. The right ventricle has normal systolic function. The cavity was  normal. There is no increase in right ventricular wall thickness.  3. The aortic root and ascending aorta are normal in size and structure.  4. No evidence of mitral valve stenosis.  5. The aortic valve is tricuspid. No stenosis of the aortic valve.     Recent Labs: 05/24/2019: Hemoglobin 14.5; Platelets 343  No results found for requested labs within last 8760 hours.   CrCl cannot be calculated (Patient's most recent lab result is older than the maximum 21 days allowed.).   Wt Readings from Last 3 Encounters:  05/09/20 244 lb (110.7 kg)  12/28/19 238 lb (108 kg)  08/19/19 224 lb (101.6 kg)     Other studies reviewed: Additional studies/records reviewed today include: summarized above  ASSESSMENT AND PLAN:  1. PVCs     Flecainide     Much improved PVC burden by her monitor, none noted again today on EKG or exam, she is feeling better     Stable intervals    Disposition: given she is on an AAD will see her Q 50mo, sooner if needed   Current medicines are reviewed at length with the patient today.  The patient did not have any concerns regarding medicines.  Norma Fredrickson, PA-C 05/09/2020 11:00 AM     [facility] [address] [place] [address] [phone] (office)  [phone] (fax)

## 2020-05-09 ENCOUNTER — Ambulatory Visit: Payer: BC Managed Care – PPO | Admitting: Physician Assistant

## 2020-05-09 ENCOUNTER — Other Ambulatory Visit: Payer: Self-pay

## 2020-05-09 ENCOUNTER — Encounter: Payer: Self-pay | Admitting: Physician Assistant

## 2020-05-09 VITALS — BP 118/70 | HR 97 | Ht 65.0 in | Wt 244.0 lb

## 2020-05-09 DIAGNOSIS — Z79899 Other long term (current) drug therapy: Secondary | ICD-10-CM | POA: Diagnosis not present

## 2020-05-09 DIAGNOSIS — I493 Ventricular premature depolarization: Secondary | ICD-10-CM

## 2020-05-09 DIAGNOSIS — Z5181 Encounter for therapeutic drug level monitoring: Secondary | ICD-10-CM

## 2020-05-09 MED ORDER — FLECAINIDE ACETATE 50 MG PO TABS: 50.0000 mg | ORAL_TABLET | Freq: Two times a day (BID) | ORAL | 3 refills | Status: DC

## 2020-05-09 NOTE — Patient Instructions (Signed)
Medication Instructions:    Your physician recommends that you continue on your current medications as directed. Please refer to the Current Medication list given to you today.  *If you need a refill on your cardiac medications before your next appointment, please call your pharmacy*   Lab Work: NONE ORDERED  TODAY   If you have labs (blood work) drawn today and your tests are completely normal, you will receive your results only by: Marland Kitchen MyChart Message (if you have MyChart) OR . A paper copy in the mail If you have any lab test that is abnormal or we need to change your treatment, we will call you to review the results.   Testing/Procedures: NONE ORDERED  TODAY    Follow-Up: At [facility], you and your health needs are our priority.  As part of our continuing mission to provide you with exceptional heart care, we have created designated Provider Care Teams.  These Care Teams include your primary Cardiologist (physician) and Advanced Practice Providers (APPs -  Physician Assistants and Nurse Practitioners) who all work together to provide you with the care you need, when you need it.  We recommend signing up for the patient portal called "MyChart".  Sign up information is provided on this After Visit Summary.  MyChart is used to connect with patients for Virtual Visits (Telemedicine).  Patients are able to view lab/test results, encounter notes, upcoming appointments, etc.  Non-urgent messages can be sent to your provider as well.   To learn more about what you can do with MyChart, go to [email or web address].    Your next appointment:   6 month(s)  The format for your next appointment:   In Person  Provider:   You may see  Dr. Graciela Husbands   Other Instructions

## 2020-05-25 ENCOUNTER — Other Ambulatory Visit: Payer: BC Managed Care – PPO

## 2020-07-12 ENCOUNTER — Telehealth: Payer: Self-pay | Admitting: Internal Medicine

## 2020-07-12 NOTE — Telephone Encounter (Signed)
   [facility] Pre-operative Risk Assessment    HEARTCARE STAFF: - Please ensure there is not already an duplicate clearance open for this procedure. - Under Visit Info/Reason for Call, type in Other and utilize the format Clearance MM/DD/YY or Clearance TBD. Do not use dashes or single digits. - If request is for dental extraction, please clarify the # of teeth to be extracted.  Request for surgical clearance:  1. What type of surgery is being performed? Endoscopy and Colonoscopy   2. When is this surgery scheduled? 07/25/20  3. What type of clearance is required (medical clearance vs. Pharmacy clearance to hold med vs. Both)? Medical   4. Are there any medications that need to be held prior to surgery and how long? No   5. Practice name and name of physician performing surgery? Dr. Cindee Salt at [facility]   6. What is the office phone number? [phone]   7.   What is the office fax number? [phone]  8.   Anesthesia type (None, local, MAC, general) ? Propofol    Tabitha Hill 07/12/2020, 8:26 AM  _________________________________________________________________   (provider comments below)

## 2020-07-12 NOTE — Telephone Encounter (Signed)
   Primary Cardiologist: Thomasene Ripple, DO  Chart reviewed as part of pre-operative protocol coverage. Given past medical history and time since last visit, based on ACC/AHA guidelines, Tabitha Hill would be at acceptable risk for the planned procedure without further cardiovascular testing.   She was recently seen in follow up 04/2020 and was doing very well from a CV standpoint. She has no prior CAD hx. She is followed by cardiology for PVCs and was noted to have low burden on last visit.   The patient was advised that if she develops new symptoms prior to surgery to contact our office to arrange for a follow-up visit, and she verbalized understanding.  I will route this recommendation to the requesting party via Epic fax function and remove from pre-op pool.  Please call with questions.  Georgie Chard, NP 07/12/2020, 8:53 AM

## 2020-09-02 ENCOUNTER — Emergency Department
Admission: EM | Admit: 2020-09-02 | Discharge: 2020-09-02 | Discharge status: Against Medical Advice | Payer: BC Managed Care – PPO | Source: Home / Self Care | Attending: Family Medicine | Admitting: Family Medicine

## 2020-09-02 ENCOUNTER — Emergency Department: Admit: 2020-09-02 | Discharge status: Against Medical Advice | Payer: Self-pay

## 2020-09-02 ENCOUNTER — Other Ambulatory Visit: Payer: Self-pay

## 2021-06-08 ENCOUNTER — Emergency Department: Admit: 2021-06-08 | Discharge status: Absent without leave | Payer: Self-pay

## 2021-06-08 ENCOUNTER — Emergency Department (INDEPENDENT_AMBULATORY_CARE_PROVIDER_SITE_OTHER)

## 2021-06-08 ENCOUNTER — Encounter: Payer: Self-pay | Admitting: Emergency Medicine

## 2021-06-08 ENCOUNTER — Other Ambulatory Visit: Payer: Self-pay

## 2021-06-08 ENCOUNTER — Emergency Department (INDEPENDENT_AMBULATORY_CARE_PROVIDER_SITE_OTHER)
Admission: EM | Admit: 2021-06-08 | Discharge: 2021-06-08 | Disposition: A | Discharge status: Home / Self Care | Source: Home / Self Care

## 2021-06-08 DIAGNOSIS — M25572 Pain in left ankle and joints of left foot: Secondary | ICD-10-CM

## 2021-06-08 MED ORDER — ACETAMINOPHEN 325 MG PO TABS
650.0000 mg | ORAL_TABLET | Freq: Once | ORAL | Status: AC
  Administered 2021-06-08: 650 mg via ORAL

## 2021-06-08 MED ORDER — IBUPROFEN 800 MG PO TABS: 800.0000 mg | ORAL_TABLET | Freq: Three times a day (TID) | ORAL | 0 refills | Status: DC

## 2021-06-08 NOTE — ED Provider Notes (Signed)
[facility]    CSN: [id number] Arrival date & time: 06/08/21  1422      History   Chief Complaint Chief Complaint  Patient presents with   Ankle Pain    HPI Tabitha Hill is a 46 y.o. female.   HPI 46 year old female presents with left ankle pain after rolling her left foot backward when stepping off a curb 115 today.  PMH significant for left ankle injury.  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      Home Medications    Prior to Admission medications   Medication Sig Start Date End Date Taking? Authorizing Provider  flecainide (TAMBOCOR) 50 MG tablet Take 1 tablet (50 mg total) by mouth 2 (two) times daily. 05/09/20   Sheilah Pigeon, PA-C  ibuprofen (ADVIL) 800 MG tablet Take 1 tablet (800 mg total) by mouth 3 (three) times daily. 06/08/21  Yes Trevor Iha, FNP  lamoTRIgine (LAMICTAL) 150 MG tablet Take 150 mg by mouth 2 (two) times daily. 09/05/16   [provider]  pantoprazole (PROTONIX) 40 MG tablet Take 40 mg by mouth daily. 02/23/21   [provider]    Family History Family History  Problem Relation Age of Onset   Cancer Father    COPD Father    Stomach cancer Maternal Grandmother    COPD Paternal Grandmother    Lung cancer Paternal Grandmother     Social History Social History   Tobacco Use   Smoking status: Never   Smokeless tobacco: Never  Vaping Use   Vaping Use: Never used  Substance Use Topics   Alcohol use: No   Drug use: No     Allergies   Codeine   Review of  Systems Review of Systems  Musculoskeletal:        Left ankle pain for 2 hours    Physical Exam Triage Vital Signs ED Triage Vitals  Enc Vitals Group     BP 06/08/21 1440 132/84     Pulse Rate 06/08/21 1440 (!) 115     Resp 06/08/21 1440 16     Temp 06/08/21 1440 99 F (37.2 C)     Temp Source 06/08/21 1440 Oral     SpO2 06/08/21 1440 95 %     Weight 06/08/21 1444 250 lb (113.4 kg)     Height 06/08/21 1444 5\' 5"  (1.651 m)     Head Circumference --      Peak Flow --      Pain Score 06/08/21 1444 8     Pain Loc --      Pain Edu? --      Excl. in GC? --    No data found.  Updated Vital Signs BP 132/84 (BP Location: Left Arm)    Pulse (!) 115    Temp 99 F (37.2 C) (Oral)    Resp 16    Ht 5\' 5"  (1.651 m)    Wt 250 lb (113.4 kg)    LMP 05/18/2021 (Approximate)  SpO2 95%    BMI 41.60 kg/m     Physical Exam Vitals and nursing note reviewed.  Constitutional:      General: She is not in acute distress.    Appearance: Normal appearance. She is obese. She is not ill-appearing.  HENT:     Head: Normocephalic and atraumatic.     Mouth/Throat:     Mouth: Mucous membranes are moist.     Pharynx: Oropharynx is clear.  Eyes:     Extraocular Movements: Extraocular movements intact.     Conjunctiva/sclera: Conjunctivae normal.     Pupils: Pupils are equal, round, and reactive to light.  Cardiovascular:     Rate and Rhythm: Normal rate and regular rhythm.     Pulses: Normal pulses.     Heart sounds: Normal heart sounds.  Pulmonary:     Effort: Pulmonary effort is normal.     Breath sounds: Normal breath sounds.  Musculoskeletal:     Cervical back: Normal range of motion and neck supple.     Comments: Left ankle: TTP over medial malleolus with mild soft tissue swelling, limited range of motion with dorsiflexion/plantarflexion, and when attempting ankle circles.  Skin:    General: Skin is warm and dry.  Neurological:     General: No focal deficit present.     Mental Status:  She is alert and oriented to person, place, and time. Mental status is at baseline.     UC Treatments / Results  Labs (all labs ordered are listed, but only abnormal results are displayed) Labs Reviewed - No data to display  EKG   Radiology DG Ankle Complete Left  Result Date: 06/08/2021 CLINICAL DATA:  rolled left foot back steppping off a curb today at 1315- pain with standing to left ankle and heel EXAM: LEFT ANKLE COMPLETE - 3+ VIEW COMPARISON:  X-ray left ankle 04/10/2014 FINDINGS: No evidence of fracture, dislocation, or joint effusion. No evidence of severe arthropathy. No aggressive appearing focal bone abnormality. Subcutaneus soft tissue edema. IMPRESSION: No acute displaced fracture or dislocation. Electronically Signed   By: Tish FredericksonMorgane  Naveau M.D.   On: 06/08/2021 15:21    Procedures Procedures (including critical care time)  Medications Ordered in UC Medications  acetaminophen (TYLENOL) tablet 650 mg (650 mg Oral Given 06/08/21 1452)    Initial Impression / Assessment and Plan / UC Course  I have reviewed the triage vital signs and the nursing notes.  Pertinent labs & imaging results that were available during my care of the patient were reviewed by me and considered in my medical decision making (see chart for details).     MDM: 1.  Acute left ankle pain-Advised/informed patient of left ankle x-ray results today.  Advised patient may alternate OTC Ibuprofen 800 mg 1-2 times daily, as needed with RICE of affected area of left ankle for 25 minutes for the next the next 3-4 days.  [facility] contact information provided with this AVS for follow-up and further evaluation of left ankle if pain worsens and/or unresolved.  Patient provided crutches today prior to discharge.  Patient discharged home, hemodynamically stable. Final Clinical Impressions(s) / UC Diagnoses   Final diagnoses:  Acute left ankle pain     Discharge Instructions       Advised/informed patient of left ankle x-ray results today.  Advised patient may alternate OTC Ibuprofen 800 mg 1-2 times daily, as needed with RICE of affected area of left ankle for 25 minutes for the next the next  3-4 days.  [facility] provider contact information provided with this AVS for follow-up and further evaluation of left ankle if pain worsens and/or unresolved.      ED Prescriptions     Medication Sig Dispense Auth. Provider   ibuprofen (ADVIL) 800 MG tablet Take 1 tablet (800 mg total) by mouth 3 (three) times daily. 30 tablet Trevor Iha, FNP      PDMP not reviewed this encounter.   Trevor Iha, FNP 06/08/21 1627

## 2021-06-08 NOTE — ED Triage Notes (Signed)
Left ankle pain after rolling foot back stepping off a curb at 1315 Pain with standing & walking Also has hand tremor after the fall today at 1315 No OTC meds or ice

## 2021-06-08 NOTE — Discharge Instructions (Addendum)
Advised/informed patient of left ankle x-ray results today.  Advised patient may alternate OTC Ibuprofen 800 mg 1-2 times daily, as needed with RICE of affected area of left ankle for 25 minutes for the next the next 3-4 days.  [facility] provider contact information provided with this AVS for follow-up and further evaluation of left ankle if pain worsens and/or unresolved.

## 2021-06-11 ENCOUNTER — Other Ambulatory Visit: Payer: Self-pay | Admitting: Physician Assistant

## 2021-06-13 ENCOUNTER — Ambulatory Visit: Payer: Self-pay

## 2021-06-13 ENCOUNTER — Ambulatory Visit (INDEPENDENT_AMBULATORY_CARE_PROVIDER_SITE_OTHER): Admitting: Family Medicine

## 2021-06-13 ENCOUNTER — Encounter: Payer: Self-pay | Admitting: Family Medicine

## 2021-06-13 VITALS — BP 120/84 | Ht 65.0 in | Wt 250.0 lb

## 2021-06-13 DIAGNOSIS — R937 Abnormal findings on diagnostic imaging of other parts of musculoskeletal system: Secondary | ICD-10-CM | POA: Insufficient documentation

## 2021-06-13 DIAGNOSIS — M25472 Effusion, left ankle: Secondary | ICD-10-CM

## 2021-06-13 DIAGNOSIS — S93492A Sprain of other ligament of left ankle, initial encounter: Secondary | ICD-10-CM

## 2021-06-13 DIAGNOSIS — M958 Other specified acquired deformities of musculoskeletal system: Secondary | ICD-10-CM | POA: Insufficient documentation

## 2021-06-13 DIAGNOSIS — M25572 Pain in left ankle and joints of left foot: Secondary | ICD-10-CM

## 2021-06-13 NOTE — Progress Notes (Signed)
°  Tabitha Hill - 46 y.o. female MRN [id number]  Date of birth: Oct 07, 1975  SUBJECTIVE:  Including CC & ROS.  No chief complaint on file.   Tabitha Hill is a 46 y.o. female that is presenting with acute left ankle pain.  The pain is occurring over the posterior aspect of the ankle.  She had a plantarflexed injury.  Review the note from 11/23 shows that she was provided meloxicam. Review of the urgent care note from 1/27 shows she was counseled on ibuprofen.  Independent review of the left ankle x-ray from 1/27 shows no acute changes.  Review of Systems See HPI   HISTORY: Past Medical, Surgical, Social, and Family History Reviewed & Updated per EMR.   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 Has degenerative changes of the t talus navicular joint with effusion. There is hyperemia over the dorsum of the neck of the talus.  There is also an effusion within the ankle joint. Posterior tibialis is intact. Effusion noted deep to the Achilles tendon and the fat pad.  Summary: Findings seem most consistent with a talus fracture and a chronically torn ATFL   ultrasound and interpretation by Clearance Coots, MD    ASSESSMENT & PLAN:   Ankle effusion, left Acutely occurring.  Had a forceful plantarflexion injury.  Seems to have an ankle  effusion which could be concerning for osteochondral defect versus a talus fracture in the neck.  There is changes of the fat pad posteriorly as well. -Counseled on home exercise therapy and supportive care. -Counseled on nonweightbearing and crutches. -Rolling knee scooter. -Could consider further imaging.  Ankle sprain Acute on chronic in nature.  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 -Counseled on home exercise therapy and supportive care. -Could consider physical therapy or further imaging.

## 2021-06-13 NOTE — Assessment & Plan Note (Signed)
Acute on chronic in nature.  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 -Counseled on home exercise therapy and supportive care. -Could consider physical therapy or further imaging.

## 2021-06-13 NOTE — Patient Instructions (Signed)
Nice to meet you Please try ice  Please use the crutches   Please send me a message in MyChart with any questions or updates.  Please see me back in 2 weeks.   --Dr. Jordan Likes

## 2021-06-13 NOTE — Assessment & Plan Note (Signed)
Acutely occurring.  Had a forceful plantarflexion injury.  Seems to have an ankle effusion which could be concerning for osteochondral defect versus a talus fracture in the neck.  There is changes of the fat pad posteriorly as well. -Counseled on home exercise therapy and supportive care. -Counseled on nonweightbearing and crutches. -Rolling knee scooter. -Could consider further imaging.

## 2021-06-27 ENCOUNTER — Ambulatory Visit (INDEPENDENT_AMBULATORY_CARE_PROVIDER_SITE_OTHER): Admitting: Family Medicine

## 2021-06-27 ENCOUNTER — Encounter: Payer: Self-pay | Admitting: Family Medicine

## 2021-06-27 VITALS — BP 120/96 | Ht 65.0 in | Wt 250.0 lb

## 2021-06-27 DIAGNOSIS — M958 Other specified acquired deformities of musculoskeletal system: Secondary | ICD-10-CM | POA: Diagnosis not present

## 2021-06-27 DIAGNOSIS — S93492D Sprain of other ligament of left ankle, subsequent encounter: Secondary | ICD-10-CM

## 2021-06-27 NOTE — Assessment & Plan Note (Signed)
Initial injury occurred at work.  Concern for osteochondral defect. -Counseled on home exercise therapy and supportive care. -Continue cam walker and nonweightbearing. -MRI of the left ankle to evaluate for osteochondral defect.

## 2021-06-27 NOTE — Patient Instructions (Signed)
Good to see you Please continue the boot and non weightbearing   Please send me a message in MyChart with any questions or updates.  We'll setup a visit once the MRi is resulted   --Dr. Jordan Likes

## 2021-06-27 NOTE — Assessment & Plan Note (Signed)
Initial injury occurred at work.  Has mild translation on exam. -Counseled on home exercise therapy and supportive care. -Could consider physical therapy.

## 2021-06-27 NOTE — Progress Notes (Signed)
°  Tabitha Hill - 46 y.o. female MRN [id number]  Date of birth: July 25, 1975  SUBJECTIVE:  Including CC & ROS.  No chief complaint on file.   Tabitha Hill is a 46 y.o. female that is following up after her injury at work of her left foot and ankle.  She is still having swelling and limited range of motion.  Bruising has improved.  The pain is occurring over the ankle joint.   Review of Systems See HPI   HISTORY: Past Medical, Surgical, Social, and Family History Reviewed & Updated per EMR.   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 Instability with anterior drawer. Pain with calcaneal tilt. Neurovascularly intact       ASSESSMENT & PLAN:   Osteochondral defect of ankle Initial injury occurred at work.  Concern for osteochondral defect. -Counseled on home exercise therapy and supportive care. -Continue cam walker and nonweightbearing. -MRI of the left ankle to evaluate for osteochondral defect.  Ankle sprain Initial injury occurred at work.  Has mild translation on exam. -Counseled on home exercise therapy and supportive care. -Could consider physical therapy.

## 2021-07-04 ENCOUNTER — Encounter: Payer: Self-pay | Admitting: Family Medicine

## 2021-07-05 ENCOUNTER — Encounter: Payer: Self-pay | Admitting: Family Medicine

## 2021-07-05 ENCOUNTER — Ambulatory Visit (INDEPENDENT_AMBULATORY_CARE_PROVIDER_SITE_OTHER): Admitting: Family Medicine

## 2021-07-05 DIAGNOSIS — S93422D Sprain of deltoid ligament of left ankle, subsequent encounter: Secondary | ICD-10-CM | POA: Diagnosis not present

## 2021-07-05 DIAGNOSIS — R937 Abnormal findings on diagnostic imaging of other parts of musculoskeletal system: Secondary | ICD-10-CM

## 2021-07-05 NOTE — Progress Notes (Signed)
°  Tabitha Hill - 46 y.o. female MRN [id number]  Date of birth: 06-Feb-1976  SUBJECTIVE:  Including CC & ROS.  No chief complaint on file.   Tabitha Hill is a 46 y.o. female that is following up after the MRI of her left foot and ankle.  This was demonstrating a complete tear of the anterior talofibular ligament and calcaneofibular ligaments.  She has a partial-thickness tear of the deltoid ligament.  She has osseous contusions of the medial malleolus and medial talus.  Has marrow edema in the base of the fourth metatarsal to suggest a nondisplaced fracture.   Review of Systems See HPI   HISTORY: Past Medical, Surgical, Social, and Family History Reviewed & Updated per EMR.   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  Injury occurred at work.  Complete tear of ATFL and CFL and partial tear of the deltoid ligament.  -Counseled on home exercise therapy and supportive care. -Referral to orthopedist.  Bone marrow edema Initial injury occurred on 1/27 at work.  MRI was revealing changes within the talus and medial malleolus as well as the base of the fourth metatarsal.

## 2021-07-05 NOTE — Assessment & Plan Note (Signed)
Initial injury occurred on 1/27 at work.  MRI was revealing changes within the talus and medial malleolus as well as the base of the fourth metatarsal.

## 2021-07-05 NOTE — Assessment & Plan Note (Signed)
Initial injury on 1/27.  Injury occurred at work.  Complete tear of ATFL and CFL and partial tear of the deltoid ligament.  -Counseled on home exercise therapy and supportive care. -Referral to orthopedist.

## 2021-08-21 DIAGNOSIS — G40209 Localization-related (focal) (partial) symptomatic epilepsy and epileptic syndromes with complex partial seizures, not intractable, without status epilepticus: Secondary | ICD-10-CM | POA: Diagnosis not present

## 2021-08-23 ENCOUNTER — Other Ambulatory Visit: Payer: Self-pay | Admitting: Internal Medicine

## 2021-08-30 ENCOUNTER — Encounter: Payer: Self-pay | Admitting: Cardiology

## 2021-09-14 ENCOUNTER — Encounter: Payer: Self-pay | Admitting: Cardiology

## 2021-09-14 ENCOUNTER — Ambulatory Visit: Payer: BC Managed Care – PPO | Admitting: Cardiology

## 2021-09-14 VITALS — BP 120/88 | HR 82 | Ht 65.0 in | Wt 247.0 lb

## 2021-09-14 DIAGNOSIS — E8881 Metabolic syndrome: Secondary | ICD-10-CM

## 2021-09-14 DIAGNOSIS — Z13220 Encounter for screening for lipoid disorders: Secondary | ICD-10-CM | POA: Diagnosis not present

## 2021-09-14 DIAGNOSIS — I493 Ventricular premature depolarization: Secondary | ICD-10-CM

## 2021-09-14 DIAGNOSIS — Z79899 Other long term (current) drug therapy: Secondary | ICD-10-CM

## 2021-09-14 MED ORDER — FLECAINIDE ACETATE 50 MG PO TABS: 50.0000 mg | ORAL_TABLET | Freq: Two times a day (BID) | ORAL | 3 refills | Status: DC

## 2021-09-14 NOTE — Patient Instructions (Signed)
Medication Instructions:  ?Your physician recommends that you continue on your current medications as directed. Please refer to the Current Medication list given to you today.  ?*If you need a refill on your cardiac medications before your next appointment, please call your pharmacy* ? ? ?Lab Work: ?Your physician recommends that you return for lab work in:  ?TODAY: BMET, Mag, LFTs, Lipids, Lp(a), Flecainide  ?If you have labs (blood work) drawn today and your tests are completely normal, you will receive your results only by: ?MyChart Message (if you have MyChart) OR ?A paper copy in the mail ?If you have any lab test that is abnormal or we need to change your treatment, we will call you to review the results. ? ? ?Testing/Procedures: ?None ? ? ?Follow-Up: ?At [facility], you and your health needs are our priority.  As part of our continuing mission to provide you with exceptional heart care, we have created designated Provider Care Teams.  These Care Teams include your primary Cardiologist (physician) and Advanced Practice Providers (APPs -  Physician Assistants and Nurse Practitioners) who all work together to provide you with the care you need, when you need it. ? ?We recommend signing up for the patient portal called "MyChart".  Sign up information is provided on this After Visit Summary.  MyChart is used to connect with patients for Virtual Visits (Telemedicine).  Patients are able to view lab/test results, encounter notes, upcoming appointments, etc.  Non-urgent messages can be sent to your provider as well.   ?To learn more about what you can do with MyChart, go to [email or web address].   ? ?Your next appointment:   ?1 year(s) ? ?The format for your next appointment:   ?In Person ? ?Provider:   ?Berniece Salines, DO   ? ? ?Other Instructions ? ? ?Important Information About Sugar ? ? ? ? ?  ?

## 2021-09-14 NOTE — Progress Notes (Signed)
?Cardiology Office Note:   ? ?Date:  09/14/2021  ? ?ID:  Tabitha Hill, DOB [birth year], MRN [id number] ? ?PCP:  Patient, No Pcp Per (Inactive)  ?Cardiologist:  Thomasene Ripple, DO  ?Electrophysiologist:  None  ? ?Referring MD: No ref. provider found  ? ?"I am doing well" ? ? ?History of Present Illness:   ? ?Tabitha Hill is a 46 y.o. 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 ? ?I did follow the patient in 2020 at which time she was there was significant PVC burden greater than 25% on her monitor she was then subsequently transferred to the care of our EP partners and has been on flecainide.  She is doing well on the flecainide her most recent monitor showed a much less PVC burden of about 2%. ? ?She is here today because she prefers to follow-up with me now.  She offers no complaints at this time ? ? ?Past Medical History:  ?Diagnosis Date  ? Anxiety   ? GERD (gastroesophageal reflux disease)   ? 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 Seizures (HCC)   ? ? ?Past Surgical History:  ?Procedure Laterality Date  ? Right Arm    ? Right foot    ? ? ?Current Medications: ?Current Meds  ?Medication Sig  ? lamoTRIgine (LAMICTAL) 150 MG tablet Take 150 mg by mouth 2 (two) times daily.  ? pantoprazole (PROTONIX) 40 MG tablet Take 40 mg by mouth daily.  ? [DISCONTINUED] flecainide (TAMBOCOR) 50 MG tablet Take 1 tablet (50 mg total) by mouth 2 (two) times daily. Please make overdue appt with Cardiologist before anymore refills. Thank you 2nd attempt  ?  ? ?Allergies:   Codeine  ? ?Social History  ? ?Socioeconomic History  ? Marital status: Single  ?  Spouse name: Not on file  ? Number of children: 1  ? Years of education: Not on file  ? Highest education level: Not on file  ?Occupational History  ? Not on file  ?Tobacco Use  ? Smoking status: Never  ? Smokeless tobacco: Never  ?Vaping Use  ? Vaping Use: Never  used  ?Substance and Sexual Activity  ? Alcohol use: No  ? Drug use: No  ? Sexual activity: Not on file  ?Other Topics Concern  ? Not on file  ?Social History Narrative  ? Not on file  ? ?Social Determinants of Health  ? ?Financial Resource Strain: Not on file  ?Food Insecurity: Not on file  ?Transportation Needs: Not on file  ?Physical Activity: Not on file  ?Stress: Not on file  ?Social Connections: Not on file  ?  ? ?Family History: ?The patient's family history includes COPD in her father and paternal grandmother; Cancer in her father; Lung cancer in her paternal grandmother; Stomach cancer in her maternal grandmother. ? ?ROS:   ?Review of Systems  ?Constitution: Negative for decreased appetite, fever and weight gain.  ?HENT: Negative for congestion, ear discharge, hoarse voice and sore throat.   ?Eyes: Negative for discharge, redness, vision loss in right eye and visual halos.  ?Cardiovascular: Negative for chest pain, dyspnea on exertion, leg swelling, orthopnea and palpitations.  ?Respiratory: Negative for cough, hemoptysis, shortness of breath and snoring.   ?Endocrine: Negative for heat intolerance and polyphagia.  ?Hematologic/Lymphatic: Negative for bleeding problem. Does not bruise/bleed easily.  ?Skin: Negative for flushing, nail changes, rash and suspicious lesions.  ?Musculoskeletal: Negative  for arthritis, joint pain, muscle cramps, myalgias, neck pain and stiffness.  ?Gastrointestinal: Negative for abdominal pain, bowel incontinence, diarrhea and excessive appetite.  ?Genitourinary: Negative for decreased libido, genital sores and incomplete emptying.  ?Neurological: Negative for brief paralysis, focal weakness, headaches and loss of balance.  ?Psychiatric/Behavioral: Negative for altered mental status, depression and suicidal ideas.  ?Allergic/Immunologic: Negative for HIV exposure and persistent infections.  ? ? ?EKGs/Labs/Other Studies Reviewed:   ? ?The following studies were reviewed  today: ? ? ?EKG:  The ekg ordered today demonstrates normal sinus rhythm, heart rate 83 bpm. ? ?Exercise treadmill test  01/11/2020 ?Blood pressure demonstrated a normal response to exercise. ?There was no ST segment deviation noted during stress. ?  ?Normal ETT ?Normal hemodynamic response ?No significant arrhythmias ? ?Zio monitor 09/22/2019 ?Indication:PVCs ?  ?Duration: 3d ?  ?Findings ?HR  avg 91  Min 58-Max 148  ?PVCs 2.2% ?PACs <1% ?  ?Recommendations ?Much improved with 10x reduction in PVCs 20>2%  ? ?Recent Labs: ?09/14/2021: ALT 9; BUN 11; Creatinine, Ser 0.81; Magnesium 2.0; Potassium 4.8; Sodium 135  ?Recent Lipid Panel ?   ?Component Value Date/Time  ? CHOL 216 (H) 09/14/2021 0913  ? TRIG 85 09/14/2021 0913  ? HDL 50 09/14/2021 0913  ? CHOLHDL 4.3 09/14/2021 0913  ? LDLCALC 151 (H) 09/14/2021 0913  ? ? ?Physical Exam:   ? ?VS:  BP 120/88   Pulse 82   Ht 5\' 5"  (1.651 m)   Wt 247 lb (112 kg)   LMP  (Within Weeks)   SpO2 97%   BMI 41.10 kg/m?    ? ?Wt Readings from Last 3 Encounters:  ?09/14/21 247 lb (112 kg)  ?07/05/21 250 lb (113.4 kg)  ?06/27/21 250 lb (113.4 kg)  ?  ? ?GEN: Well nourished, well developed in no acute distress ?HEENT: Normal ?NECK: No JVD; No carotid bruits ?LYMPHATICS: No lymphadenopathy ?CARDIAC: S1S2 noted,RRR, no murmurs, rubs, gallops ?RESPIRATORY:  Clear to auscultation without rales, wheezing or rhonchi  ?ABDOMEN: Soft, non-tender, non-distended, +bowel sounds, no guarding. ?EXTREMITIES: No edema, No cyanosis, no clubbing ?MUSCULOSKELETAL:  No deformity  ?SKIN: Warm and dry ?NEUROLOGIC:  Alert and oriented x 3, non-focal ?PSYCHIATRIC:  Normal affect, good insight ? ?ASSESSMENT:   ? ?1. Medication management   ?2. PVC's (premature ventricular contractions)   ?3. Screening for hyperlipidemia   ?4. Metabolic syndrome   ?5. Morbid obesity (HCC)   ? ?PLAN:   ? ? ?1.  She appears to be doing well from a cardiovascular standpoint.  We will continue her flecainide.  We will get a  flecainide level today.  We will get baseline labs BMP, mag as well as lipid profile. ? ?2.  The patient understands the need to lose weight with diet and exercise. We have discussed specific strategies for this. ? ?The patient is in agreement with the above plan. The patient left the office in stable condition.  The patient will follow up in ? ? ?Medication Adjustments/Labs and Tests Ordered: ?Current medicines are reviewed at length with the patient today.  Concerns regarding medicines are outlined above.  ?Orders Placed This Encounter  ?Procedures  ? Basic Metabolic Panel (BMET)  ? Magnesium  ? Hepatic function panel  ? Lipid panel  ? Lipoprotein A (LPA)  ? Flecainide level  ? EKG 12-Lead  ? ?Meds ordered this encounter  ?Medications  ? flecainide (TAMBOCOR) 50 MG tablet  ?  Sig: Take 1 tablet (50 mg total) by mouth 2 (two) times  daily. Please make overdue appt with Cardiologist before anymore refills. Thank you 2nd attempt  ?  Dispense:  180 tablet  ?  Refill:  3  ?  Please call our office to schedule an overdue appointment with Cardiologist before anymore refills. [phone]. Thank you 2nd attempt  ? ? ?Patient Instructions  ?Medication Instructions:  ?Your physician recommends that you continue on your current medications as directed. Please refer to the Current Medication list given to you today.  ?*If you need a refill on your cardiac medications before your next appointment, please call your pharmacy* ? ? ?Lab Work: ?Your physician recommends that you return for lab work in:  ?TODAY: BMET, Mag, LFTs, Lipids, Lp(a), Flecainide  ?If you have labs (blood work) drawn today and your tests are completely normal, you will receive your results only by: ?MyChart Message (if you have MyChart) OR ?A paper copy in the mail ?If you have any lab test that is abnormal or we need to change your treatment, we will call you to review the results. ? ? ?Testing/Procedures: ?None ? ? ?Follow-Up: ?At [facility], you and  your health needs are our priority.  As part of our continuing mission to provide you with exceptional heart care, we have created designated Provider Care Teams.  These Care Teams include your primary Cardiologist (physici

## 2021-09-20 ENCOUNTER — Other Ambulatory Visit: Payer: Self-pay

## 2021-09-20 MED ORDER — ROSUVASTATIN CALCIUM 5 MG PO TABS: 5.0000 mg | ORAL_TABLET | Freq: Every day | ORAL | 3 refills | Status: DC

## 2021-09-20 NOTE — Progress Notes (Signed)
Prescription sent to pharmacy.

## 2021-10-03 LAB — LIPID PANEL
Chol/HDL Ratio: 4.3 ratio (ref 0.0–4.4)
Cholesterol, Total: 216 mg/dL — ABNORMAL HIGH (ref 100–199)
HDL: 50 mg/dL (ref >39)
LDL Chol Calc (NIH): 151 mg/dL — ABNORMAL HIGH (ref 0–99)
Triglycerides: 85 mg/dL (ref 0–149)
VLDL Cholesterol Cal: 15 mg/dL (ref 5–40)

## 2021-10-03 LAB — BASIC METABOLIC PANEL
BUN/Creatinine Ratio: 14 (ref 9–23)
BUN: 11 mg/dL (ref 6–24)
CO2: 22 mmol/L (ref 20–29)
Calcium: 9 mg/dL (ref 8.7–10.2)
Chloride: 101 mmol/L (ref 96–106)
Creatinine, Ser: 0.81 mg/dL (ref 0.57–1.00)
Glucose: 94 mg/dL (ref 70–99)
Potassium: 4.8 mmol/L (ref 3.5–5.2)
Sodium: 135 mmol/L (ref 134–144)
eGFR: 91 mL/min/{1.73_m2} (ref >59)

## 2021-10-03 LAB — FLECAINIDE LEVEL: Flecainide: <0.09 ug/ml — ABNORMAL LOW (ref 0.20–1.00)

## 2021-10-03 LAB — HEPATIC FUNCTION PANEL
ALT: 9 IU/L (ref 0–32)
AST: 11 IU/L (ref 0–40)
Albumin: 4.2 g/dL (ref 3.8–4.8)
Alkaline Phosphatase: 91 IU/L (ref 44–121)
Bilirubin Total: 0.4 mg/dL (ref 0.0–1.2)
Bilirubin, Direct: <0.1 mg/dL (ref 0.00–0.40)
Total Protein: 7.4 g/dL (ref 6.0–8.5)

## 2021-10-03 LAB — LIPOPROTEIN A (LPA): Lipoprotein (a): 40.5 nmol/L (ref <75.0)

## 2021-10-03 LAB — MAGNESIUM: Magnesium: 2 mg/dL (ref 1.6–2.3)

## 2021-10-06 ENCOUNTER — Encounter: Payer: Self-pay | Admitting: Cardiology

## 2021-10-09 ENCOUNTER — Other Ambulatory Visit: Payer: Self-pay

## 2021-10-09 MED ORDER — FLECAINIDE ACETATE 50 MG PO TABS: 50.0000 mg | ORAL_TABLET | Freq: Two times a day (BID) | ORAL | 3 refills | Status: DC

## 2021-10-09 NOTE — Progress Notes (Addendum)
Prescription instructions corrected for Flecainide. Crestor removed from medication list per pt's request. No other changes.

## 2021-10-09 NOTE — Addendum Note (Signed)
Addended by: Orvan July on: 10/09/2021 01:54 PM   Modules accepted: Orders

## 2021-11-05 DIAGNOSIS — Z01419 Encounter for gynecological examination (general) (routine) without abnormal findings: Secondary | ICD-10-CM | POA: Diagnosis not present

## 2021-11-05 DIAGNOSIS — Z1231 Encounter for screening mammogram for malignant neoplasm of breast: Secondary | ICD-10-CM | POA: Diagnosis not present

## 2021-11-05 DIAGNOSIS — Z6841 Body Mass Index (BMI) 40.0 and over, adult: Secondary | ICD-10-CM | POA: Diagnosis not present

## 2021-11-05 DIAGNOSIS — Z124 Encounter for screening for malignant neoplasm of cervix: Secondary | ICD-10-CM | POA: Diagnosis not present

## 2022-02-15 IMAGING — DX DG ANKLE COMPLETE 3+V*L*
3 series · 3 of 3 positions shown · non-contrast
Comparison: X-ray left ankle 04/10/2014

CLINICAL DATA: rolled left foot back steppping off a curb today at
8489- pain with standing to left ankle and heel

EXAM:
LEFT ANKLE COMPLETE - 3+ VIEW

[ankle ap]
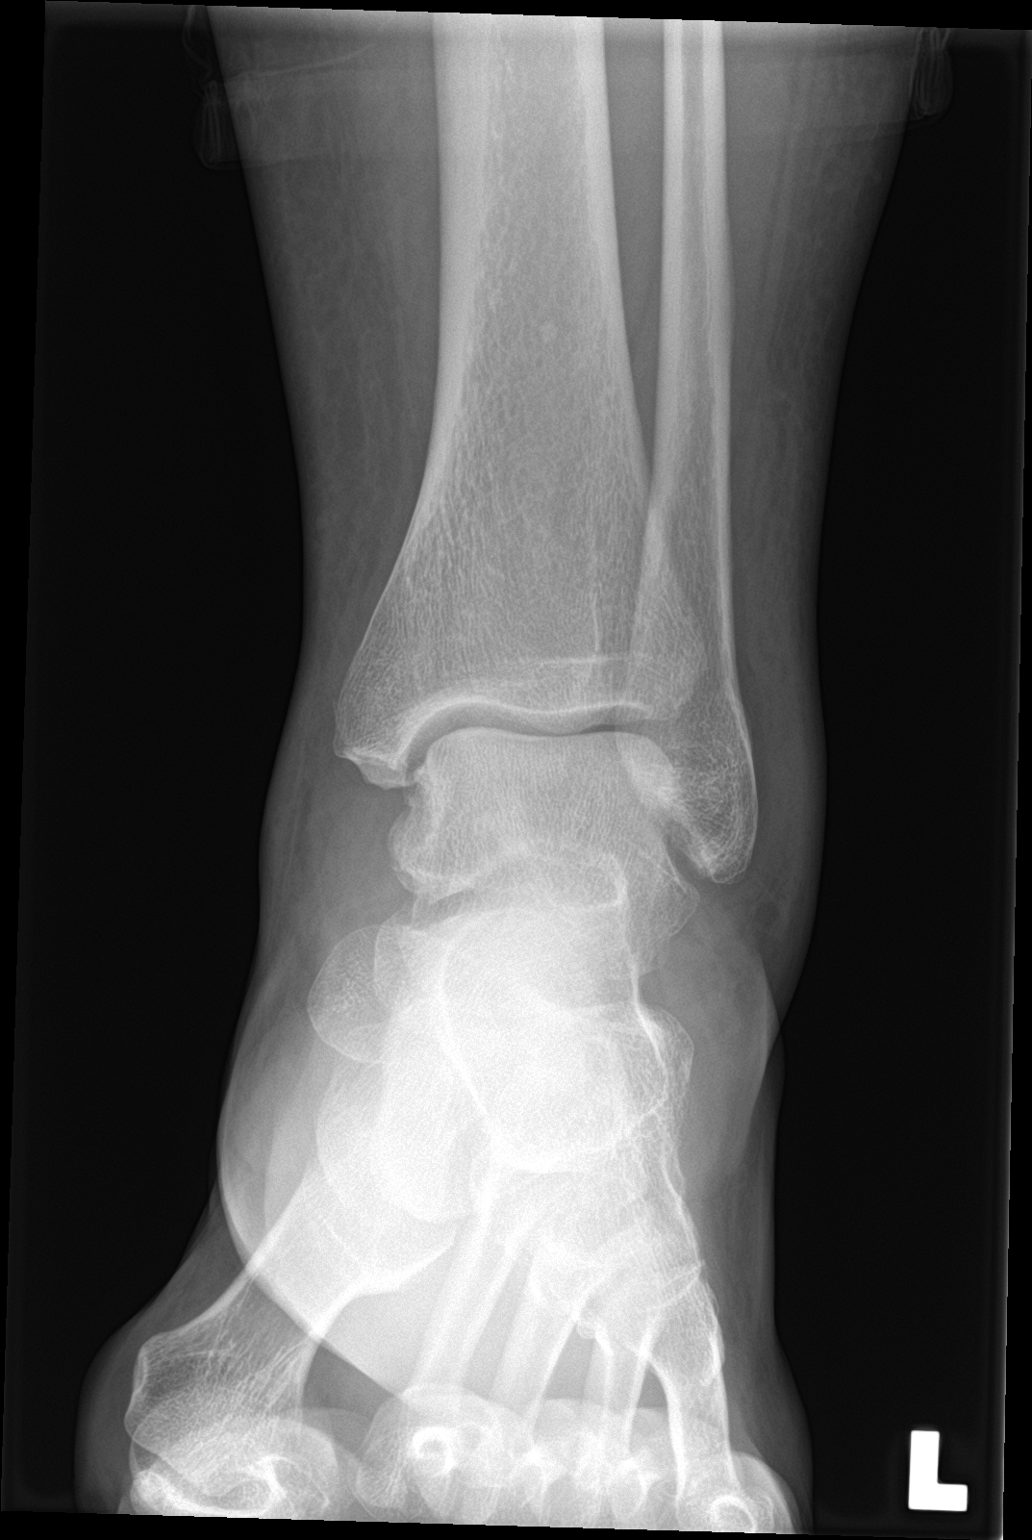

[ankle obl]
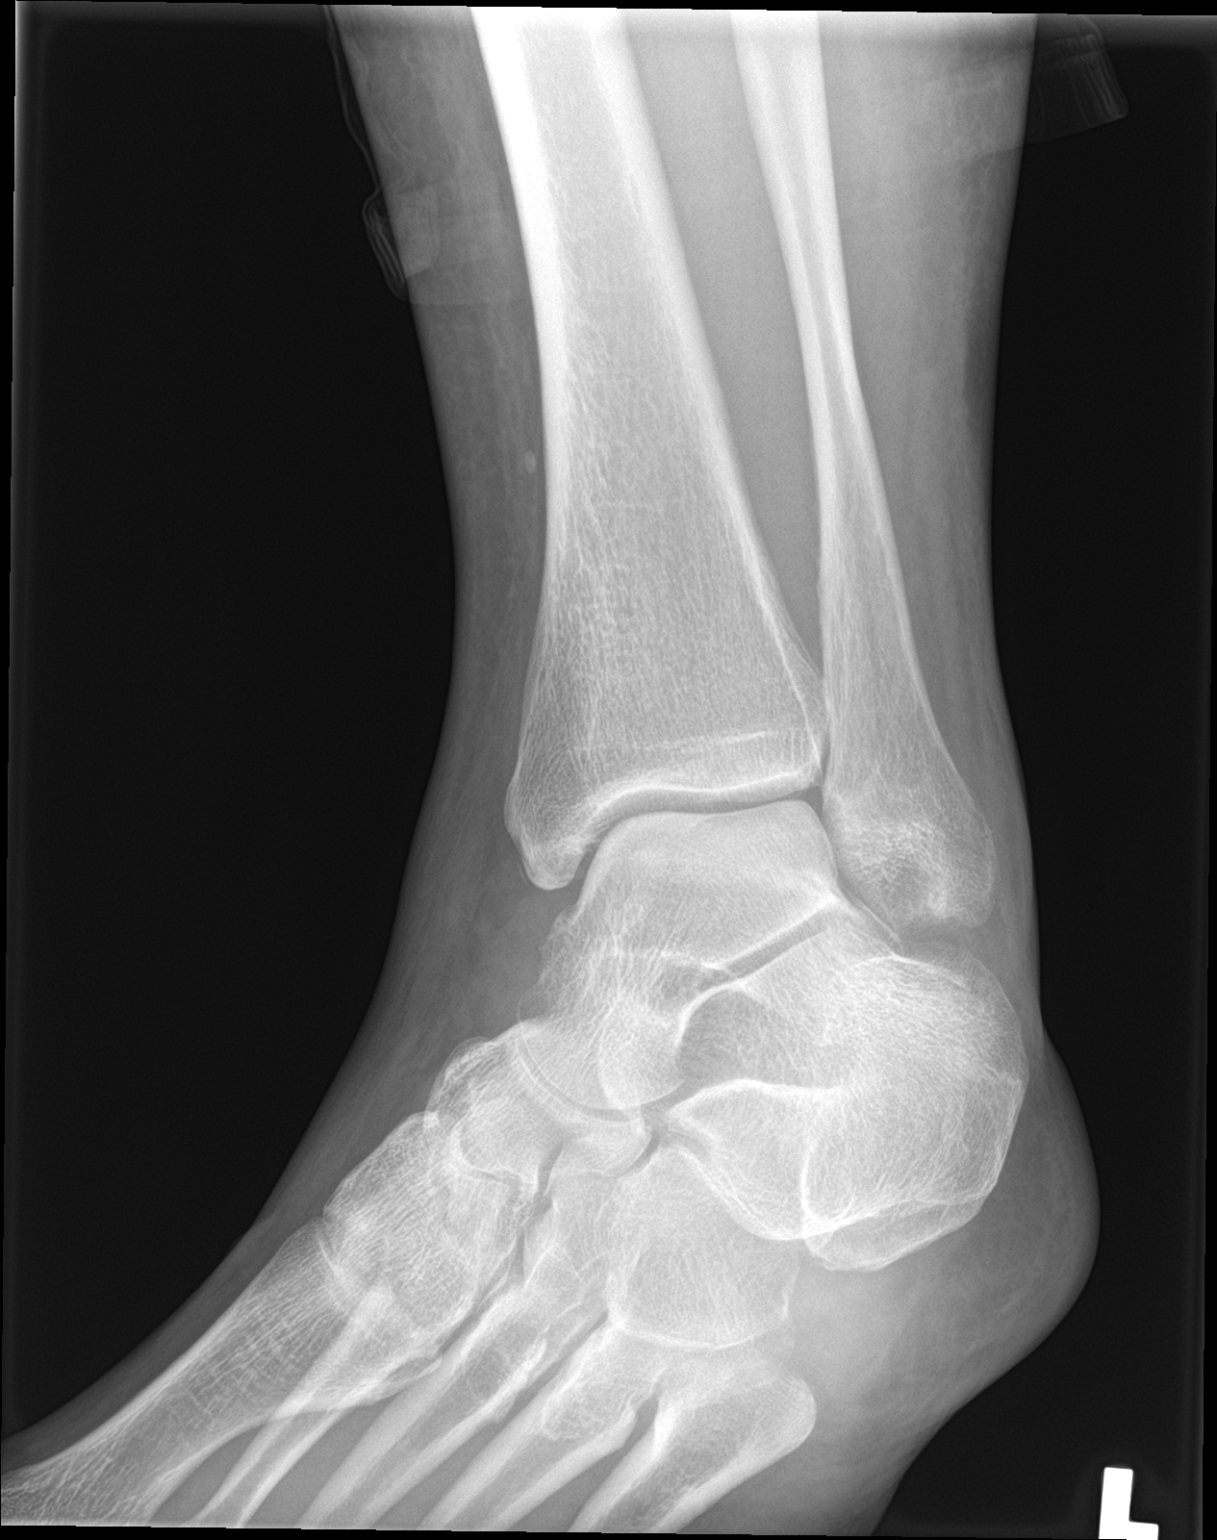

[ankle lat]
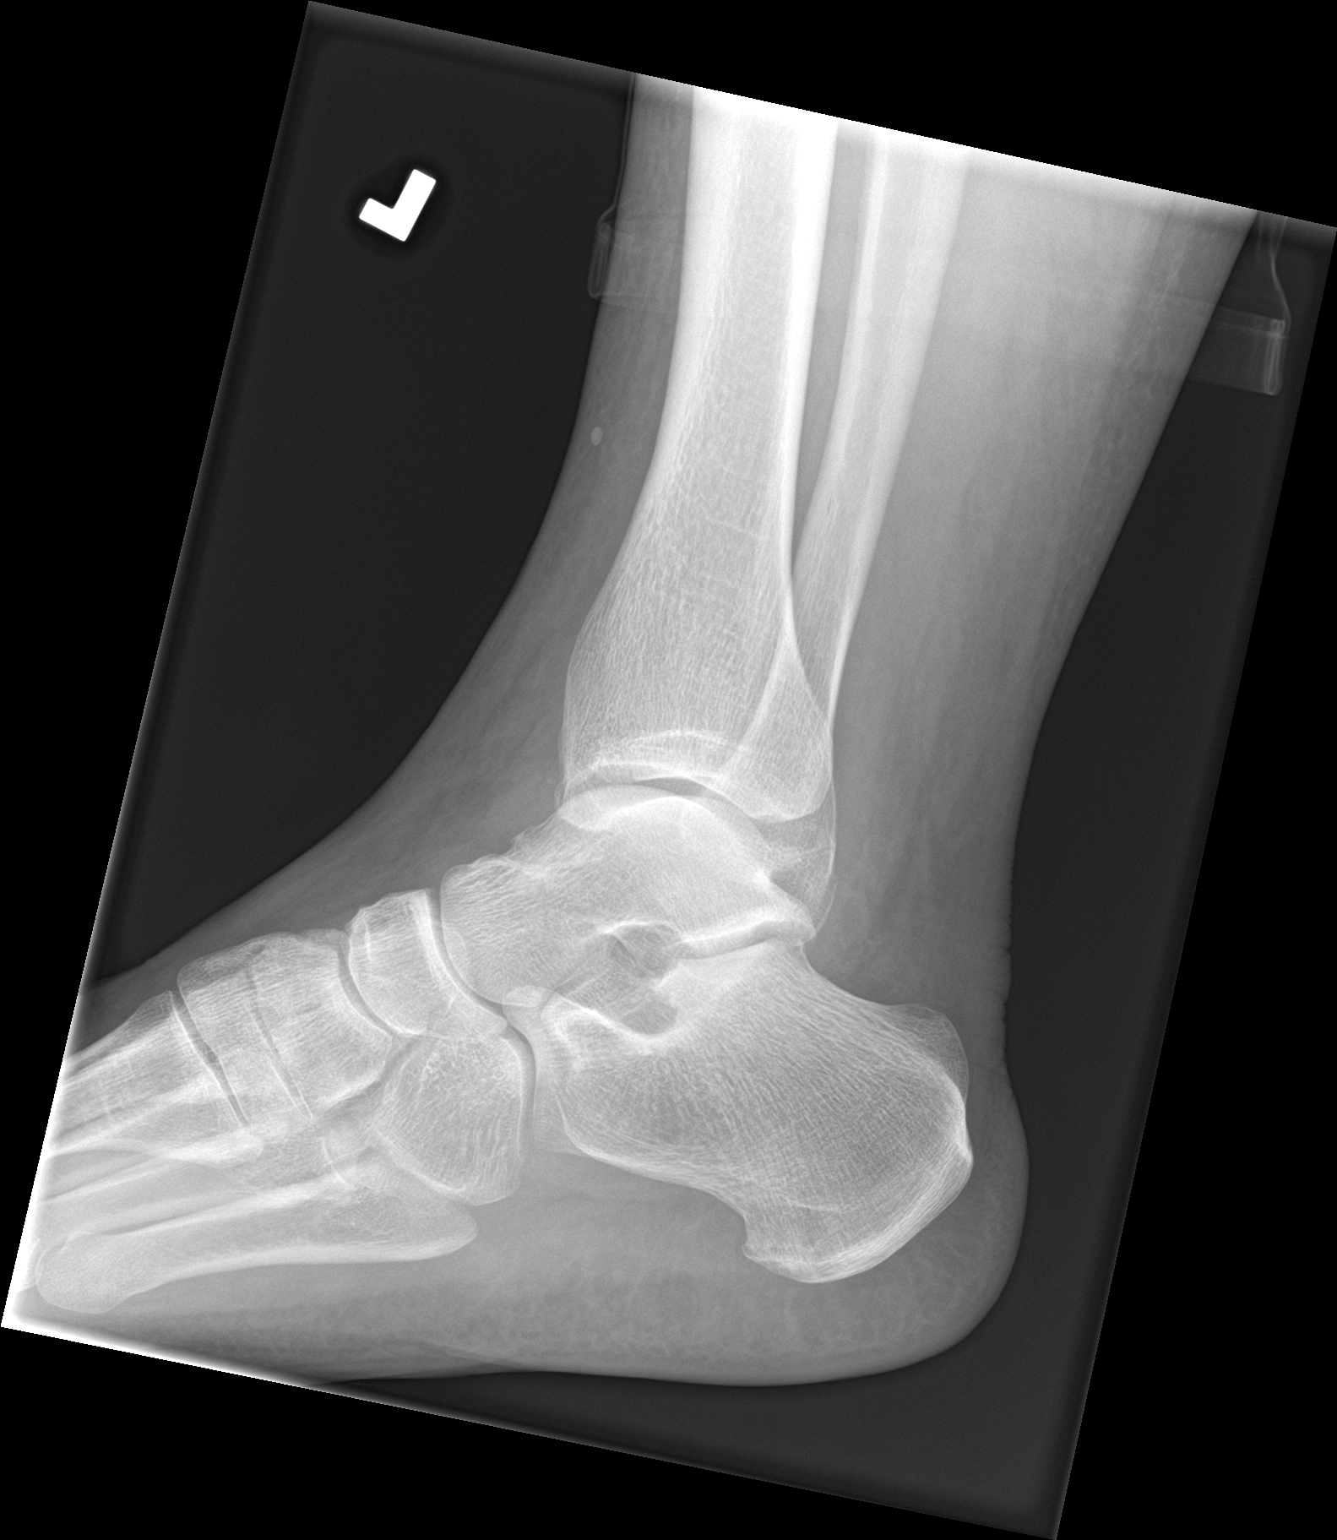

[3 of 3 positions shown; findings below may reference images not displayed]

FINDINGS: No evidence of fracture, dislocation, or joint effusion. No evidence
of severe arthropathy. No aggressive appearing focal bone
abnormality. Subcutaneus soft tissue edema.
IMPRESSION: No acute displaced fracture or dislocation.

## 2022-05-16 DIAGNOSIS — K219 Gastro-esophageal reflux disease without esophagitis: Secondary | ICD-10-CM | POA: Diagnosis not present

## 2022-05-16 DIAGNOSIS — C49A0 Gastrointestinal stromal tumor, unspecified site: Secondary | ICD-10-CM | POA: Diagnosis not present

## 2022-07-11 DIAGNOSIS — S90852A Superficial foreign body, left foot, initial encounter: Secondary | ICD-10-CM | POA: Diagnosis not present

## 2022-08-03 DIAGNOSIS — J101 Influenza due to other identified influenza virus with other respiratory manifestations: Secondary | ICD-10-CM | POA: Diagnosis not present

## 2022-08-03 DIAGNOSIS — R000 Tachycardia, unspecified: Secondary | ICD-10-CM | POA: Diagnosis not present

## 2022-08-03 DIAGNOSIS — Z20822 Contact with and (suspected) exposure to covid-19: Secondary | ICD-10-CM | POA: Diagnosis not present

## 2022-08-07 DIAGNOSIS — Q828 Other specified congenital malformations of skin: Secondary | ICD-10-CM | POA: Diagnosis not present

## 2022-08-07 DIAGNOSIS — R000 Tachycardia, unspecified: Secondary | ICD-10-CM | POA: Diagnosis not present

## 2022-08-08 ENCOUNTER — Telehealth: Payer: Self-pay | Admitting: Cardiology

## 2022-08-08 NOTE — Telephone Encounter (Signed)
Returned call to patient who states that she has been having some palpitations. Patient reports that yesterday she went Podiatry and had removed 2 corns on left foot yesterday- and states that it hurt last night and this morning her left leg is swollen. Patient reports that left leg at calf is tight and not sore to touch. Patient reports that left foot felt like her left foot is throbbing or states she can feel her heart beat in her foot. Patient denies any tenderness to the calf, denies warmness, or any streaking. Patient reports that her left calf is swollen and states that it feels tight all the way up her calf. Patient reports that she spoke with her podiatrist and they told her that it couldn't be anything to do with what she had done yesterday. Patient has had DVT study before on left leg for swelling, no history of DVT though. Patient also states that she has had some fluttering- patient feels like she may not be well hydrated. Advised patient to ensure adequate hydration, patient also reports that her BP has been [id number] systolic over [id number] recently at work. Patient would like to know if Dr. Harriet Masson has any recommendations. Will forward message over.

## 2022-08-08 NOTE — Telephone Encounter (Signed)
Patient c/o Palpitations:  High priority if patient c/o lightheadedness, shortness of breath, or chest pain  How long have you had palpitations/irregular HR/ Afib? Are you having the symptoms now? When pt was initially diagnosed with PVC's, she felt this flutter, but since has not had problems til now. Pt is having the flutter right now/   Are you currently experiencing lightheadedness, SOB or CP? The closest thing to any of the above is lightheadedness, but states "not really", so she would say no. Then states that she does feel nauseous, but not at the point of throwing up.   Do you have a history of afib (atrial fibrillation) or irregular heart rhythm? No  Have you checked your BP or HR? (document readings if available): Not today. When she was at the doctor yesterday it was high - 136/80. She states she can check it at work if need be.   Are you experiencing any other symptoms? No    Pt states that she had some podiatry work done yesterday, and now today she feels her heart fluttering. She states she can feel her pulse in her foot, and feels the same flutter in her chest. She states that it feels like her heart is bouncing back and forth. Per her watch, her HR is 100. She states her lt leg is swollen, that only begun since her podiatry appt yesterday 3/27.   Pt c/o swelling: STAT is pt has developed SOB within 24 hours  How much weight have you gained and in what time span? No  If swelling, where is the swelling located? Lt foot  Are you currently taking a fluid pill? No  Are you currently SOB? No  Do you have a log of your daily weights (if so, list)? No  Have you gained 3 pounds in a day or 5 pounds in a week? N/a  Have you traveled recently? No   She states it feels like when she stands up, her foot is really tight.

## 2022-08-12 ENCOUNTER — Ambulatory Visit: Payer: BC Managed Care – PPO | Attending: Cardiology | Admitting: Cardiology

## 2022-08-12 ENCOUNTER — Encounter: Payer: Self-pay | Admitting: Cardiology

## 2022-08-12 VITALS — BP 146/92 | HR 96 | Ht 65.0 in | Wt 232.0 lb

## 2022-08-12 DIAGNOSIS — I10 Essential (primary) hypertension: Secondary | ICD-10-CM

## 2022-08-12 DIAGNOSIS — I493 Ventricular premature depolarization: Secondary | ICD-10-CM

## 2022-08-12 DIAGNOSIS — E8881 Metabolic syndrome: Secondary | ICD-10-CM

## 2022-08-12 DIAGNOSIS — Z79899 Other long term (current) drug therapy: Secondary | ICD-10-CM | POA: Diagnosis not present

## 2022-08-12 MED ORDER — CARVEDILOL 3.125 MG PO TABS: 3.1250 mg | ORAL_TABLET | Freq: Two times a day (BID) | ORAL | 3 refills | Status: DC

## 2022-08-12 NOTE — Patient Instructions (Signed)
Medication Instructions:  Your physician has recommended you make the following change in your medication:  START: Coreg 3.125 mg twice daily *If you need a refill on your cardiac medications before your next appointment, please call your pharmacy*   Lab Work: Your physician recommends that you have labs drawn today: CMET, Mag, Flecainide  If you have labs (blood work) drawn today and your tests are completely normal, you will receive your results only by: [place] (if you have MyChart) OR A paper copy in the mail If you have any lab test that is abnormal or we need to change your treatment, we will call you to review the results.   Testing/Procedures: None   Follow-Up: At [facility], you and your health needs are our priority.  As part of our continuing mission to provide you with exceptional heart care, we have created designated Provider Care Teams.  These Care Teams include your primary Cardiologist (physician) and Advanced Practice Providers (APPs -  Physician Assistants and Nurse Practitioners) who all work together to provide you with the care you need, when you need it.   Your next appointment:   16 week(s)  Provider:   Berniece Salines, DO

## 2022-08-12 NOTE — Progress Notes (Signed)
Cardiology Office Note:    Date:  08/12/2022   ID:  [name], DOB 1975/09/18, MRN [id number]  PCP:  Patient, No Pcp Per  Cardiologist:  Berniece Salines, DO  Electrophysiologist:  None   Referring MD: No ref. provider found   "I am doing well"  History of Present Illness:    Tabitha Hill is a 47 y.o. 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  I did follow the patient in 2020 at which time she was there was significant PVC burden greater than 25% on her monitor she was then subsequently transferred to the care of our EP partners and has been on flecainide.  She is doing well on the flecainide her most recent monitor showed a much less PVC burden of about 2%.  At her last visit she presented to reestablish care with me after following up with only EP for her frequent PVCs. During that visit she appeared to be doing well from cardiovascular standpoint.  No medication changes were made.   Today she is presents because recently she has been experiencing intermittent fluttering. She reports that she had a podiatric procedure which shortly after she started to have these symptoms. She also notes that her blood pressure has been checked at work and she noticed that her blood pressure has also been elevated.   Of note she has had some pain and her foot that was operated on but her podiatrist advise that she should not be worried.   She is planning a trip and wanted to get evaluated prior to her trip. No chest pain and no shortness of breath.   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   flecainide (TAMBOCOR) 50 MG tablet Take 1 tablet (50 mg total) by mouth 2 (two) times daily.   lamoTRIgine (LAMICTAL) 150 MG tablet Take 150 mg by mouth 2 (two) times daily.   pantoprazole (PROTONIX) 40 MG tablet Take 40 mg by mouth daily.     Allergies:   Codeine   Social History   Socioeconomic History   Marital status: Single    Spouse name: Not on file   Number of children: 1   Years of education: Not on file   Highest education level: Not on file  Occupational History   Not on file  Tobacco Use   Smoking status: Never   Smokeless tobacco: Never  Vaping Use   Vaping Use: Never used  Substance and Sexual Activity   Alcohol use: No   Drug use: No   Sexual activity: Not on file  Other Topics Concern   Not on file  Social History Narrative   Not on file   Social Determinants of Health   Financial Resource Strain: Not on file  Food Insecurity: Not on file  Transportation Needs: Not on file  Physical Activity: Not on file  Stress: Not on file  Social Connections: Not on file     Family History: The patient's family history includes  COPD in her father and paternal grandmother; Cancer in her father; Lung cancer in her paternal grandmother; Stomach cancer in her maternal grandmother.  ROS:   Review of Systems  Constitution: Negative for decreased appetite, fever and weight gain.  HENT: Negative for congestion, ear discharge, hoarse voice and sore throat.   Eyes: Negative for discharge, redness, vision loss in right eye and visual halos.  Cardiovascular: Negative for chest pain, dyspnea on exertion, leg swelling, orthopnea and palpitations.  Respiratory: Negative for cough, hemoptysis, shortness of breath and snoring.   Endocrine: Negative for heat intolerance and polyphagia.  Hematologic/Lymphatic: Negative for bleeding problem. Does not bruise/bleed easily.  Skin: Negative for flushing, nail changes, rash and  suspicious lesions.  Musculoskeletal: Negative for arthritis, joint pain, muscle cramps, myalgias, neck pain and stiffness.  Gastrointestinal: Negative for abdominal pain, bowel incontinence, diarrhea and excessive appetite.  Genitourinary: Negative for decreased libido, genital sores and incomplete emptying.  Neurological: Negative for brief paralysis, focal weakness, headaches and loss of balance.  Psychiatric/Behavioral: Negative for altered mental status, depression and suicidal ideas.  Allergic/Immunologic: Negative for HIV exposure and persistent infections.    EKGs/Labs/Other Studies Reviewed:    The following studies were reviewed today:   EKG:  The ekg ordered today demonstrates normal sinus rhythm, heart rate 83 bpm.  Exercise treadmill test  01/11/2020 Blood pressure demonstrated a normal response to exercise. There was no ST segment deviation noted during stress.   Normal ETT Normal hemodynamic response No significant arrhythmias  Zio monitor 09/22/2019 Indication:PVCs   Duration: 3d   Findings HR  avg 91  Min 58-Max 148  PVCs 2.2% PACs <1%   Recommendations Much improved with 10x reduction in PVCs 20>2%   Recent Labs: 09/14/2021: ALT 9; BUN 11; Creatinine, Ser 0.81; Magnesium 2.0; Potassium 4.8; Sodium 135  Recent Lipid Panel    Component Value Date/Time   CHOL 216 (H) 09/14/2021 0913   TRIG 85 09/14/2021 0913   HDL 50 09/14/2021 0913   CHOLHDL 4.3 09/14/2021 0913   LDLCALC 151 (H) 09/14/2021 0913    Physical Exam:    VS:  BP (!) 146/92 (BP Location: Left Arm, Patient Position: Sitting, Cuff Size: Normal)   Pulse 96   Ht 5\' 5"  (1.651 m)   Wt 232 lb (105.2 kg)   BMI 38.61 kg/m     Wt Readings from Last 3 Encounters:  08/12/22 232 lb (105.2 kg)  09/14/21 247 lb (112 kg)  07/05/21 250 lb (113.4 kg)     GEN: Well nourished, well developed in no acute distress HEENT: Normal NECK: No JVD; No carotid bruits LYMPHATICS: No lymphadenopathy CARDIAC:  S1S2 noted,RRR, no murmurs, rubs, gallops RESPIRATORY:  Clear to auscultation without rales, wheezing or rhonchi  ABDOMEN: Soft, non-tender, non-distended, +bowel sounds, no guarding. EXTREMITIES: No edema, No cyanosis, no clubbing MUSCULOSKELETAL:  No deformity  SKIN: Warm and dry NEUROLOGIC:  Alert and oriented x 3, non-focal PSYCHIATRIC:  Normal affect, good insight  ASSESSMENT:    1. PVC's (premature ventricular contractions)   2. Medication management   3. Metabolic syndrome   4. Primary hypertension     PLAN:    Blood pressure is elevated in the office today - this is not new reports elevated blood pressure at work as well. Will start coreg 6.25 mg BID.   In terms of the fluttering, will continue her current flecainide , hoping that adding the coreg will help with the flutter.    The patient is in agreement with  the above plan. The patient left the office in stable condition.  The patient will follow up in 16 weeks or sooner if needed.   Medication Adjustments/Labs and Tests Ordered: Current medicines are reviewed at length with the patient today.  Concerns regarding medicines are outlined above.  Orders Placed This Encounter  Procedures   Comprehensive Metabolic Panel (CMET)   Magnesium   Flecainide level   EKG 12-Lead   Meds ordered this encounter  Medications   carvedilol (COREG) 3.125 MG tablet    Sig: Take 1 tablet (3.125 mg total) by mouth 2 (two) times daily.    Dispense:  180 tablet    Refill:  3    Patient Instructions  Medication Instructions:  Your physician has recommended you make the following change in your medication:  START: Coreg 3.125 mg twice daily *If you need a refill on your cardiac medications before your next appointment, please call your pharmacy*   Lab Work: Your physician recommends that you have labs drawn today: CMET, Mag, Flecainide  If you have labs (blood work) drawn today and your tests are completely normal, you will  receive your results only by: [place] (if you have MyChart) OR A paper copy in the mail If you have any lab test that is abnormal or we need to change your treatment, we will call you to review the results.   Testing/Procedures: None   Follow-Up: At [facility], you and your health needs are our priority.  As part of our continuing mission to provide you with exceptional heart care, we have created designated Provider Care Teams.  These Care Teams include your primary Cardiologist (physician) and Advanced Practice Providers (APPs -  Physician Assistants and Nurse Practitioners) who all work together to provide you with the care you need, when you need it.   Your next appointment:   16 week(s)  Provider:   Berniece Salines, DO      Adopting a Healthy Lifestyle.  Know what a healthy weight is for you (roughly BMI <25) and aim to maintain this   Aim for 7+ servings of fruits and vegetables daily   65-80+ fluid ounces of water or unsweet tea for healthy kidneys   Limit to max 1 drink of alcohol per day; avoid smoking/tobacco   Limit animal fats in diet for cholesterol and heart health - choose grass fed whenever available   Avoid highly processed foods, and foods high in saturated/trans fats   Aim for low stress - take time to unwind and care for your mental health   Aim for 150 min of moderate intensity exercise weekly for heart health, and weights twice weekly for bone health   Aim for 7-9 hours of sleep daily   When it comes to diets, agreement about the perfect plan isnt easy to find, even among the experts. Experts at the [employer] developed an idea known as the Healthy Eating Plate. Just imagine a plate divided into logical, healthy portions.   The emphasis is on diet quality:   Load up on vegetables and fruits - one-half of your plate: Aim for color and variety, and remember that potatoes dont count.   Go for whole grains -  one-quarter of your plate: Whole wheat, barley, wheat berries, quinoa, oats, brown rice, and foods made with them. If you want pasta, go with whole wheat pasta.   Protein power - one-quarter of your plate: Fish, chicken, beans, and nuts are all healthy,  versatile protein sources. Limit red meat.   The diet, however, does go beyond the plate, offering a few other suggestions.   Use healthy plant oils, such as olive, canola, soy, corn, sunflower and peanut. Check the labels, and avoid partially hydrogenated oil, which have unhealthy trans fats.   If youre thirsty, drink water. Coffee and tea are good in moderation, but skip sugary drinks and limit milk and dairy products to one or two daily servings.   The type of carbohydrate in the diet is more important than the amount. Some sources of carbohydrates, such as vegetables, fruits, whole grains, and beans-are healthier than others.   Finally, stay active  Signed, Berniece Salines, DO  08/12/2022 6:29 PM    [facility]

## 2022-08-13 ENCOUNTER — Encounter: Payer: Self-pay | Admitting: Cardiology

## 2022-08-16 ENCOUNTER — Encounter: Payer: Self-pay | Admitting: Cardiology

## 2022-08-19 LAB — COMPREHENSIVE METABOLIC PANEL
ALT: 14 IU/L (ref 0–32)
AST: 16 IU/L (ref 0–40)
Albumin/Globulin Ratio: 1.5 (ref 1.2–2.2)
Albumin: 4.4 g/dL (ref 3.9–4.9)
Alkaline Phosphatase: 91 IU/L (ref 44–121)
BUN/Creatinine Ratio: 13 (ref 9–23)
BUN: 11 mg/dL (ref 6–24)
Bilirubin Total: 0.2 mg/dL (ref 0.0–1.2)
CO2: 21 mmol/L (ref 20–29)
Calcium: 9 mg/dL (ref 8.7–10.2)
Chloride: 101 mmol/L (ref 96–106)
Creatinine, Ser: 0.83 mg/dL (ref 0.57–1.00)
Globulin, Total: 3 g/dL (ref 1.5–4.5)
Glucose: 92 mg/dL (ref 70–99)
Potassium: 4.1 mmol/L (ref 3.5–5.2)
Sodium: 138 mmol/L (ref 134–144)
Total Protein: 7.4 g/dL (ref 6.0–8.5)
eGFR: 87 mL/min/{1.73_m2} (ref >59)

## 2022-08-19 LAB — MAGNESIUM: Magnesium: 2.2 mg/dL (ref 1.6–2.3)

## 2022-08-19 LAB — FLECAINIDE LEVEL: Flecainide: 0.24 ug/ml (ref 0.20–1.00)

## 2022-08-26 ENCOUNTER — Encounter: Payer: Self-pay | Admitting: *Deleted

## 2022-09-19 ENCOUNTER — Other Ambulatory Visit: Payer: Self-pay | Admitting: Cardiology

## 2022-10-30 DIAGNOSIS — G40209 Localization-related (focal) (partial) symptomatic epilepsy and epileptic syndromes with complex partial seizures, not intractable, without status epilepticus: Secondary | ICD-10-CM | POA: Diagnosis not present

## 2022-10-30 DIAGNOSIS — E538 Deficiency of other specified B group vitamins: Secondary | ICD-10-CM | POA: Diagnosis not present

## 2022-10-30 DIAGNOSIS — Z1331 Encounter for screening for depression: Secondary | ICD-10-CM | POA: Diagnosis not present

## 2022-11-28 ENCOUNTER — Ambulatory Visit: Payer: BC Managed Care – PPO | Attending: Cardiology | Admitting: Cardiology

## 2022-11-28 VITALS — BP 122/82 | HR 96 | Ht 65.0 in | Wt 236.2 lb

## 2022-11-28 DIAGNOSIS — I493 Ventricular premature depolarization: Secondary | ICD-10-CM

## 2022-11-28 DIAGNOSIS — E8881 Metabolic syndrome: Secondary | ICD-10-CM | POA: Diagnosis not present

## 2022-11-28 DIAGNOSIS — Z79899 Other long term (current) drug therapy: Secondary | ICD-10-CM

## 2022-11-28 NOTE — Progress Notes (Signed)
Cardiology Office Note:    Date:  11/29/2022   ID:  Tabitha Hill, DOB [birth year], MRN [id number]  PCP:  Patient, No Pcp Per  Cardiologist:  Thomasene Ripple, DO  Electrophysiologist:  None   Referring MD: No ref. provider found   "I am doing well"  History of Present Illness:    Tabitha Hill is a 47 y.o. 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  At her last visit she was experiencing intermittent fluttering and elevated blood pressure- added coreg.  She took for sometime has titrated off. She is not taking this medication any more.   She feels a lot better.   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   pantoprazole (PROTONIX) 40 MG tablet Take 40 mg by mouth daily.     Allergies:   Codeine   Social History   Socioeconomic History   Marital status: Single    Spouse name: Not on file   Number of children: 1   Years of education: Not on file   Highest education level: Not on file  Occupational History   Not on file  Tobacco Use   Smoking status: Never   Smokeless tobacco: Never  Vaping Use   Vaping status: Never Used  Substance and Sexual Activity   Alcohol use: No   Drug use: No   Sexual activity: Not on file  Other Topics Concern   Not on file  Social History Narrative   Not on file   Social Determinants of Health   Financial Resource Strain: Low Risk  (02/19/2021)   Received from [facility], [facility]   Overall Financial  Resource Strain (CARDIA)    Difficulty of Paying Living Expenses: Not very hard  Food Insecurity: No Food Insecurity (08/21/2021)   Received from [facility], [facility]   Hunger Vital Sign    Worried About Running Out of Food in the Last Year: Never true    Ran Out of Food in the Last Year: Never true  Transportation Needs: No Transportation Needs (02/19/2021)   Received from [employer], [facility]   PRAPARE - Transportation    Lack of Transportation (Medical): No    Lack of Transportation (Non-Medical): No  Physical Activity: Inactive (02/19/2021)   Received from [facility], [facility]   Exercise Vital Sign    Days of Exercise per Week: 0 days    Minutes of Exercise per Session: 0 min  Stress: No Stress Concern Present (02/19/2021)   Received from [facility], [facility] - Occupational Stress Questionnaire    Feeling of Stress : Not at all  Social Connections: Unknown (09/21/2021)   Received from [facility], [facility]   Social Network    Social Network: Not on file     Family History: The patient's family history includes COPD in her father and paternal grandmother; Cancer in  her father; Lung cancer in her paternal grandmother; Stomach cancer in her maternal grandmother.  ROS:   Review of Systems  Constitution: Negative for decreased appetite, fever and weight gain.  HENT: Negative for congestion, ear discharge, hoarse voice and sore throat.   Eyes: Negative for discharge, redness, vision loss in right eye and visual halos.  Cardiovascular: Negative for chest pain, dyspnea on exertion, leg swelling, orthopnea and palpitations.  Respiratory: Negative for cough, hemoptysis, shortness of breath and snoring.   Endocrine: Negative for heat intolerance and polyphagia.  Hematologic/Lymphatic: Negative for bleeding problem. Does not bruise/bleed easily.  Skin: Negative for flushing, nail changes, rash and  suspicious lesions.  Musculoskeletal: Negative for arthritis, joint pain, muscle cramps, myalgias, neck pain and stiffness.  Gastrointestinal: Negative for abdominal pain, bowel incontinence, diarrhea and excessive appetite.  Genitourinary: Negative for decreased libido, genital sores and incomplete emptying.  Neurological: Negative for brief paralysis, focal weakness, headaches and loss of balance.  Psychiatric/Behavioral: Negative for altered mental status, depression and suicidal ideas.  Allergic/Immunologic: Negative for HIV exposure and persistent infections.    EKGs/Labs/Other Studies Reviewed:    The following studies were reviewed today:   EKG:  The ekg ordered today demonstrates normal sinus rhythm, heart rate 83 bpm.  Exercise treadmill test  01/11/2020 Blood pressure demonstrated a normal response to exercise. There was no ST segment deviation noted during stress.   Normal ETT Normal hemodynamic response No significant arrhythmias  Zio monitor 09/22/2019 Indication:PVCs   Duration: 3d   Findings HR  avg 91  Min 58-Max 148  PVCs 2.2% PACs <1%   Recommendations Much improved with 10x reduction in PVCs 20>2%   Recent Labs: 08/12/2022: ALT 14; BUN 11; Creatinine, Ser 0.83; Magnesium 2.2; Potassium 4.1; Sodium 138  Recent Lipid Panel    Component Value Date/Time   CHOL 216 (H) 09/14/2021 0913   TRIG 85 09/14/2021 0913   HDL 50 09/14/2021 0913   CHOLHDL 4.3 09/14/2021 0913   LDLCALC 151 (H) 09/14/2021 0913    Physical Exam:    VS:  BP 122/82   Pulse 96   Ht 5\' 5"  (1.651 m)   Wt 236 lb 3.2 oz (107.1 kg)   SpO2 96%   BMI 39.31 kg/m     Wt Readings from Last 3 Encounters:  11/28/22 236 lb 3.2 oz (107.1 kg)  08/12/22 232 lb (105.2 kg)  09/14/21 247 lb (112 kg)     GEN: Well nourished, well developed in no acute distress HEENT: Normal NECK: No JVD; No carotid bruits LYMPHATICS: No lymphadenopathy CARDIAC: S1S2 noted,RRR, no murmurs, rubs,  gallops RESPIRATORY:  Clear to auscultation without rales, wheezing or rhonchi  ABDOMEN: Soft, non-tender, non-distended, +bowel sounds, no guarding. EXTREMITIES: No edema, No cyanosis, no clubbing MUSCULOSKELETAL:  No deformity  SKIN: Warm and dry NEUROLOGIC:  Alert and oriented x 3, non-focal PSYCHIATRIC:  Normal affect, good insight  ASSESSMENT:    1. Medication management   2. PVC's (premature ventricular contractions)   3. Metabolic syndrome   4. Morbid obesity (HCC)      PLAN:    Fluttering resolve, has titrated herself off coreg I will not restart.  Continue flecainide. Will get levels today.  Continue diet and exercise.  The patient is in agreement with the above plan. The patient left the office in stable condition.  The patient will follow up in 16 weeks or sooner if needed.   Medication Adjustments/Labs and Tests Ordered: Current medicines are reviewed at length with the  patient today.  Concerns regarding medicines are outlined above.  Orders Placed This Encounter  Procedures   Flecainide level   No orders of the defined types were placed in this encounter.   Patient Instructions  Medication Instructions:  Your physician has recommended you make the following change in your medication: STOP: Coreg *If you need a refill on your cardiac medications before your next appointment, please call your pharmacy*   Lab Work: Your physician recommends that you return for lab work in:  Flecainide level If you have labs (blood work) drawn today and your tests are completely normal, you will receive your results only by: MyChart Message (if you have MyChart) OR A paper copy in the mail If you have any lab test that is abnormal or we need to change your treatment, we will call you to review the results.   Testing/Procedures: None   Follow-Up: At [facility], you and your health needs are our priority.  As part of our continuing mission to provide you with  exceptional heart care, we have created designated Provider Care Teams.  These Care Teams include your primary Cardiologist (physician) and Advanced Practice Providers (APPs -  Physician Assistants and Nurse Practitioners) who all work together to provide you with the care you need, when you need it.   Your next appointment:   1 year(s)  Provider:   Thomasene Ripple, DO     Adopting a Healthy Lifestyle.  Know what a healthy weight is for you (roughly BMI <25) and aim to maintain this   Aim for 7+ servings of fruits and vegetables daily   65-80+ fluid ounces of water or unsweet tea for healthy kidneys   Limit to max 1 drink of alcohol per day; avoid smoking/tobacco   Limit animal fats in diet for cholesterol and heart health - choose grass fed whenever available   Avoid highly processed foods, and foods high in saturated/trans fats   Aim for low stress - take time to unwind and care for your mental health   Aim for 150 min of moderate intensity exercise weekly for heart health, and weights twice weekly for bone health   Aim for 7-9 hours of sleep daily   When it comes to diets, agreement about the perfect plan isnt easy to find, even among the experts. Experts at the [facility] [employer] developed an idea known as the Healthy Eating Plate. Just imagine a plate divided into logical, healthy portions.   The emphasis is on diet quality:   Load up on vegetables and fruits - one-half of your plate: Aim for color and variety, and remember that potatoes dont count.   Go for whole grains - one-quarter of your plate: Whole wheat, barley, wheat berries, quinoa, oats, brown rice, and foods made with them. If you want pasta, go with whole wheat pasta.   Protein power - one-quarter of your plate: Fish, chicken, beans, and nuts are all healthy, versatile protein sources. Limit red meat.   The diet, however, does go beyond the plate, offering a few other suggestions.   Use healthy  plant oils, such as olive, canola, soy, corn, sunflower and peanut. Check the labels, and avoid partially hydrogenated oil, which have unhealthy trans fats.   If youre thirsty, drink water. Coffee and tea are good in moderation, but skip sugary drinks and limit milk and dairy products to one or two daily servings.   The type of carbohydrate in the diet is more  important than the amount. Some sources of carbohydrates, such as vegetables, fruits, whole grains, and beans-are healthier than others.   Finally, stay active  Signed, Thomasene Ripple, DO  11/29/2022 1:20 PM    [facility]

## 2022-11-28 NOTE — Patient Instructions (Signed)
Medication Instructions:  Your physician has recommended you make the following change in your medication: STOP: Coreg *If you need a refill on your cardiac medications before your next appointment, please call your pharmacy*   Lab Work: Your physician recommends that you return for lab work in:  Flecainide level If you have labs (blood work) drawn today and your tests are completely normal, you will receive your results only by: MyChart Message (if you have MyChart) OR A paper copy in the mail If you have any lab test that is abnormal or we need to change your treatment, we will call you to review the results.   Testing/Procedures: None   Follow-Up: At [facility], you and your health needs are our priority.  As part of our continuing mission to provide you with exceptional heart care, we have created designated Provider Care Teams.  These Care Teams include your primary Cardiologist (physician) and Advanced Practice Providers (APPs -  Physician Assistants and Nurse Practitioners) who all work together to provide you with the care you need, when you need it.   Your next appointment:   1 year(s)  Provider:   Thomasene Ripple, DO

## 2022-12-10 DIAGNOSIS — Z124 Encounter for screening for malignant neoplasm of cervix: Secondary | ICD-10-CM | POA: Diagnosis not present

## 2022-12-10 DIAGNOSIS — Z01419 Encounter for gynecological examination (general) (routine) without abnormal findings: Secondary | ICD-10-CM | POA: Diagnosis not present

## 2022-12-10 DIAGNOSIS — Z6838 Body mass index (BMI) 38.0-38.9, adult: Secondary | ICD-10-CM | POA: Diagnosis not present

## 2022-12-10 DIAGNOSIS — Z1231 Encounter for screening mammogram for malignant neoplasm of breast: Secondary | ICD-10-CM | POA: Diagnosis not present

## 2022-12-16 ENCOUNTER — Other Ambulatory Visit: Payer: Self-pay | Admitting: Obstetrics and Gynecology

## 2022-12-16 DIAGNOSIS — R928 Other abnormal and inconclusive findings on diagnostic imaging of breast: Secondary | ICD-10-CM

## 2022-12-30 ENCOUNTER — Ambulatory Visit: Payer: BC Managed Care – PPO

## 2022-12-30 ENCOUNTER — Ambulatory Visit
Admission: RE | Admit: 2022-12-30 | Discharge: 2022-12-30 | Disposition: A | Discharge status: Home / Self Care | Payer: BC Managed Care – PPO | Source: Ambulatory Visit | Attending: Obstetrics and Gynecology | Admitting: Obstetrics and Gynecology

## 2022-12-30 DIAGNOSIS — R928 Other abnormal and inconclusive findings on diagnostic imaging of breast: Secondary | ICD-10-CM

## 2022-12-30 DIAGNOSIS — R922 Inconclusive mammogram: Secondary | ICD-10-CM | POA: Diagnosis not present

## 2023-03-17 ENCOUNTER — Ambulatory Visit: Payer: Self-pay | Admitting: Podiatry

## 2023-03-27 ENCOUNTER — Ambulatory Visit: Payer: BC Managed Care – PPO | Admitting: Podiatry

## 2023-03-27 ENCOUNTER — Encounter: Payer: Self-pay | Admitting: Podiatry

## 2023-03-27 DIAGNOSIS — B070 Plantar wart: Secondary | ICD-10-CM | POA: Diagnosis not present

## 2023-03-27 NOTE — Progress Notes (Signed)
  Subjective:  Patient ID: Tabitha Hill, female    DOB: 1975/08/20,  MRN: [id number]  Chief Complaint  Patient presents with   Callouses    Patient states has a corn on the bottom of left foot since April now painful at times.    Discussed the use of AI scribe software for clinical note transcription with the patient, who gave verbal consent to proceed.  History of Present Illness   The patient presents with a foot lesion that has been present since April. Initially, she sought care from a podiatrist who treated a similar lesion in the middle of her foot that has since resolved. However, the lesion under her toe has persisted despite her attempts to shave it down. The lesion is slightly painful when palpated but is otherwise not causing significant discomfort. The patient denies any other symptoms related to the lesion.          Objective:    Physical Exam   EXTREMITIES: Bilateral feet are warm and well perfused with good capillary refill time. SKIN: Left foot exhibits a verruca plantaris, characterized by pain to direct palpation and lateral compression, with disruption of skin lines.       No images are attached to the encounter.    Results   Procedure: Shaving of dead skin and application of cantharone Description: Shaved down dead skin on plantar wart. Applied cantharone to the area to cause blistering and peeling of the skin. Informed Consent: Counseled about the risks of pain and scarring with excision, benefits of chemical treatment with cantharone, and alternatives including laser treatment and surgical excision. Advised on the use of salicylic acid at home and precautions to prevent spreading.  She should remove the dressing and wash with soap and water in 12 but no more than 24 hours      Assessment:   1. Verruca plantaris      Plan:  Patient was evaluated and treated and all questions answered.  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 The lesion is painful due to pressure from skin growth. We will apply Cantharone today to initiate blistering and peeling of skin layers. A follow-up in a few weeks will assess progress. After the initial blister heals, she should apply salicylic acid (Compound W, 40-50% strength) nightly to continue the peeling process. We will consider laser treatment or excision if not responsive to chemical treatment. She is advised to wear socks and avoid barefoot walking to prevent spread and to check family members' feet for similar lesions. She should remove the dressing and wash with soap and water in 12 but no more than 24 hours          Return in about 3 weeks (around 04/17/2023) for wart treatment.

## 2023-03-27 NOTE — Patient Instructions (Signed)
VISIT SUMMARY:  You came in today for a persistent lesion under your left toe that has been present since April. Previously, you saw a podiatrist for a similar issue in the middle of your foot, which has since resolved. The current lesion is slightly painful when touched but not causing significant discomfort otherwise.  YOUR PLAN:  -PLANTAR WART: A plantar wart is a benign skin growth caused by the human papillomavirus (HPV). It can be painful due to pressure from the skin growth. Today, we applied Cantharone to initiate blistering and peeling of the skin layers. After the initial blister heals, you should apply salicylic acid (Compound W, 40-50% strength) nightly to continue the peeling process. If the lesion does not respond to this treatment, we may consider laser treatment or surgical removal. To prevent spreading the virus, wear socks and avoid walking barefoot. Also, check your family members' feet for similar lesions.  INSTRUCTIONS:  Please follow up in a few weeks to assess the progress of the treatment. Continue applying salicylic acid nightly after the initial blister heals. If you notice any new symptoms or if the lesion does not improve, contact our office.

## 2023-04-17 ENCOUNTER — Ambulatory Visit (INDEPENDENT_AMBULATORY_CARE_PROVIDER_SITE_OTHER): Payer: BC Managed Care – PPO | Admitting: Podiatry

## 2023-04-17 DIAGNOSIS — Z91199 Patient's noncompliance with other medical treatment and regimen due to unspecified reason: Secondary | ICD-10-CM

## 2023-04-19 NOTE — Progress Notes (Signed)
Patient was no-show for appointment today 

## 2023-05-12 ENCOUNTER — Encounter: Payer: Self-pay | Admitting: Cardiology

## 2023-05-12 NOTE — Telephone Encounter (Signed)
Called pt and scheduled appt 05-15-23, verbalized understanding she will arrive early to check in

## 2023-05-12 NOTE — Telephone Encounter (Signed)
Pt called in stating she does not wish to go to the ER. Please advise.

## 2023-05-15 ENCOUNTER — Ambulatory Visit: Payer: BC Managed Care – PPO | Attending: Physician Assistant | Admitting: Physician Assistant

## 2023-05-15 VITALS — BP 110/80 | HR 103 | Ht 65.0 in | Wt 240.0 lb

## 2023-05-15 DIAGNOSIS — Z79899 Other long term (current) drug therapy: Secondary | ICD-10-CM

## 2023-05-15 DIAGNOSIS — I493 Ventricular premature depolarization: Secondary | ICD-10-CM

## 2023-05-15 DIAGNOSIS — Z5181 Encounter for therapeutic drug level monitoring: Secondary | ICD-10-CM | POA: Diagnosis not present

## 2023-05-15 DIAGNOSIS — R0789 Other chest pain: Secondary | ICD-10-CM

## 2023-05-15 NOTE — Progress Notes (Signed)
 Cardiology Office Note:  .   Date:  05/15/2023  ID:  Tabitha Hill, DOB Oct 17, 1975, MRN [id number] PCP: Patient, No Pcp Per  [facility] Providers Cardiologist:  Dub Huntsman, DO    History of Present Illness: .   Tabitha Hill is a 48 y.o. female with history of frequent PVCs, anxiety and GERD.  Her PVCs improved after started on flecainide .  Carvedilol  was later added however eventually discontinued.  Echocardiogram obtained on 01/21/2019 showed EF 55 to 60%, no regional wall motion abnormality, normal RV.  ETT obtained on 01/11/2020 was normal without significant arrhythmia.  Initial heart monitor in September 2020 showed frequent PVCs with 25% burden, however after started on medications, PVC burden came down to 2.2% on repeat heart monitor in May 2021.  Patient was last seen by Dr. Huntsman on 11/28/2018 for at which time she was doing well.  Patient presents today for evaluation of left-sided chest pain under the left breast.  She described as a sharp pain that initially started around Christmas.  It was worse when she twists her upper torso, taking a deep breath or palpate the area.  In the last few days, the pain has essentially became constant.  She called our office and was seen Droxia to go to the emergency room which she refused.  When she bent over to change the cat litter box and when she stood up she felt lightheaded and had heart palpitation.  Initial blood pressure was 110/80.  Heart rate was 103.  Looking back, her heart rate has always been on the borderline high side.  During the previous visits, heart rate has been around 96 bpm.  EKG showed normal sinus rhythm without significant ST-T wave changes.  Her chest pain is atypical, I do not recommend any further ischemic workup.  She is overdue for plain old treadmill exercise test for being on flecainide .  As for dizziness, recommended adequate hydration.  She does not have any significant shortness of breath, therefore I did not  order echocardiogram.  We will the patient back in 3 months for reassessment of her atypical chest pain.  I did ask the patient to take some over-the-counter ibuprofen , if rib pain continue to persist after 2 weeks, she has been instructed to call us  and I will order a two-view chest x-ray.  ROS:   She denies chest pain, palpitations, dyspnea, pnd, orthopnea, n, v, dizziness, syncope, edema, weight gain, or early satiety. All other systems reviewed and are otherwise negative except as noted above.  She does have some left rib pain.   Studies Reviewed: .        Cardiac Studies & Procedures     STRESS TESTS  EXERCISE TOLERANCE TEST (ETT) 01/11/2020  Narrative  Blood pressure demonstrated a normal response to exercise.  There was no ST segment deviation noted during stress.  Normal ETT Normal hemodynamic response No significant arrhythmias  ECHOCARDIOGRAM  ECHOCARDIOGRAM COMPLETE 01/21/2019  Narrative ECHOCARDIOGRAM REPORT    Patient Name:   Tabitha Hill Date of Exam: 01/21/2019 Medical Rec #:  [id number]        Height:       66.0 in Accession #:    [id number]       Weight:       217.0 lb Date of Birth:  Aug 27, 1975         BSA:          2.07 m Patient Age:    38 years  BP:           110/90 mmHg Patient Gender: F                HR:           72 bpm. Exam Location:  [place]   Procedure: 2D Echo  Indications:    PVC's  History:        Patient has no prior history of Echocardiogram examinations. PVC.  Sonographer:    Fairy Canton RDCS (AE) Referring Phys: [id number] KARDIE TOBB  IMPRESSIONS   1. The left ventricle has normal systolic function, with an ejection fraction of 55-60%. The cavity size was normal. Left ventricular diastolic parameters were normal. No evidence of left ventricular regional wall motion abnormalities. 2. The right ventricle has normal systolic function. The cavity was normal. There is no increase in right ventricular wall thickness. 3.  The aortic root and ascending aorta are normal in size and structure. 4. No evidence of mitral valve stenosis. 5. The aortic valve is tricuspid. No stenosis of the aortic valve.  FINDINGS Left Ventricle: The left ventricle has normal systolic function, with an ejection fraction of 55-60%. The cavity size was normal. There is no increase in left ventricular wall thickness. Left ventricular diastolic parameters were normal. No evidence of left ventricular regional wall motion abnormalities..  Right Ventricle: The right ventricle has normal systolic function. The cavity was normal. There is no increase in right ventricular wall thickness.  Left Atrium: Left atrial size was normal in size.  Right Atrium: Right atrial size was normal in size. Right atrial pressure is estimated at 3 mmHg.  Interatrial Septum: No atrial level shunt detected by color flow Doppler.  Pericardium: There is no evidence of pericardial effusion.  Mitral Valve: The mitral valve is normal in structure. Mitral valve regurgitation is mild by color flow Doppler. No evidence of mitral valve stenosis. Pulmonary venous flow is normal.  Tricuspid Valve: The tricuspid valve is normal in structure. Tricuspid valve regurgitation is trivial by color flow Doppler.  Aortic Valve: The aortic valve is tricuspid Aortic valve regurgitation was not visualized by color flow Doppler. There is No stenosis of the aortic valve.  Pulmonic Valve: The pulmonic valve was normal in structure. Pulmonic valve regurgitation is not visualized by color flow Doppler. No evidence of pulmonic stenosis.  Aorta: The aortic root and ascending aorta are normal in size and structure.  Venous: The inferior vena cava measures 1.93 cm, is normal in size with greater than 50% respiratory variability.   +--------------+--------++ LEFT VENTRICLE         +----------------+----------++ +--------------+--------++ Diastology                 PLAX 2D                 +----------------+----------++ +--------------+--------++ LV e' lateral:  18.80 cm/s LVIDd:        5.61 cm  +----------------+----------++ +--------------+--------++ LV E/e' lateral:4.1        LVIDs:        4.29 cm  +----------------+----------++ +--------------+--------++ LV e' medial:   10.30 cm/s LV PW:        0.98 cm  +----------------+----------++ +--------------+--------++ LV E/e' medial: 7.4        LV IVS:       1.03 cm  +----------------+----------++ +--------------+--------++ LVOT diam:    2.20 cm  +--------------+--------++ LV SV:        72 ml    +--------------+--------++ LV SV  Index:  32.87    +--------------+--------++ LVOT Area:    3.80 cm +--------------+--------++                        +--------------+--------++  +---------------+----------++ RIGHT VENTRICLE           +---------------+----------++ RV Basal diam: 3.05 cm    +---------------+----------++ RV S prime:    13.50 cm/s +---------------+----------++ TAPSE (M-mode):2.5 cm     +---------------+----------++  +---------------+-------++-----------++ LEFT ATRIUM           Index       +---------------+-------++-----------++ LA diam:       3.60 cm1.74 cm/m  +---------------+-------++-----------++ LA Vol (A2C):  64.2 ml31.01 ml/m +---------------+-------++-----------++ LA Vol (A4C):  53.2 ml25.67 ml/m +---------------+-------++-----------++ LA Biplane Vol:64.1 ml30.96 ml/m +---------------+-------++-----------++ +------------+---------++-----------++ RIGHT ATRIUM         Index       +------------+---------++-----------++ RA Area:    13.80 cm            +------------+---------++-----------++ RA Volume:  32.30 ml 15.60 ml/m +------------+---------++-----------++ +------------+-----------++ AORTIC VALVE            +------------+-----------++ LVOT Vmax:  92.40 cm/s   +------------+-----------++ LVOT Vmean: 67.800 cm/s +------------+-----------++ LVOT VTI:   0.203 m     +------------+-----------++  +-------------+-------++ AORTA                +-------------+-------++ Ao Root diam:2.50 cm +-------------+-------++ Ao Asc diam: 3.00 cm +-------------+-------++  +--------------+-----------++ +---------------+-----------++ MITRAL VALVE              TRICUSPID VALVE            +--------------+-----------++ +---------------+-----------++ MV Area (PHT):1.73 cm    TR Peak grad:  23.7 mmHg   +--------------+-----------++ +---------------+-----------++ MV PHT:       127.02 msec TR Vmax:       271.00 cm/s +--------------+-----------++ +---------------+-----------++ MV Decel Time:438 msec    +--------------+-----------++ +--------------+-------+ +--------------+----------++  SHUNTS                MV E velocity:76.20 cm/s  +--------------+-------+ +--------------+----------++  Systemic VTI: 0.20 m  MV A velocity:39.30 cm/s  +--------------+-------+ +--------------+----------++  Systemic Diam:2.20 cm MV E/A ratio: 1.94        +--------------+-------+ +--------------+----------++  +---------+-------+ IVC              +---------+-------+ IVC diam:1.93 cm +---------+-------+   Redell Leiter MD Electronically signed by Redell Leiter MD Signature Date/Time: 01/21/2019/5:45:09 PM    Final   MONITORS  LONG TERM MONITOR (3-14 DAYS) 09/22/2019  Narrative Indication:PVCs  Duration: 3d  Findings HR  avg 91  Min 58-Max 148 PVCs 2.2% PACs <1%  Recommendations Much improved with 10x reduction in PVCs 20>2%  CT SCANS  CT CORONARY MORPH W/CTA COR W/SCORE 04/05/2019  Addendum 04/12/2019  4:26 PM ADDENDUM REPORT: 04/05/2019 20:14  CLINICAL DATA:  48 yo female with chest pain and significant premature ventricular complexes.  EXAM: Cardiac/Coronary   CT  TECHNIQUE: The patient was scanned on a [employer].  FINDINGS: A 120 kV prospective scan was triggered in the descending thoracic aorta at 111 HU's. Axial non-contrast 3 mm slices were carried out through the heart. The data set was analyzed on a dedicated work station and scored using the Agatson method. Gantry rotation speed was 250 msecs and collimation was .6 mm. No beta blockade and 0.8 mg of sl NTG was given. The 3D data set was reconstructed in 5% intervals of the 67-82 %  of the R-R cycle. Diastolic phases were analyzed on a dedicated work station using MPR, MIP and VRT modes. The patient received 80 cc of contrast.  Aorta: Normal size.  No calcifications.  No dissection.  Aortic Valve:  Trileaflet.  No calcifications.  Coronary Arteries:  Normal coronary origin.  Right dominance.  RCA is a large dominant artery that gives rise to PDA and PLVB. The mid portion of the RCA does have some artifact with no evidence of a plaque.  Left main is a large artery that gives rise to LAD and LCX arteries.  LAD is a large vessel that has no plaque. There is one Diagonal branch. There is no plaque.  LCX is a non-dominant artery that gives rise to one large OM1 branch. There is no plaque.  Other findings:  Normal pulmonary vein drainage into the left atrium with no evidence of pulmonary stenosis.  Normal let atrial appendage without a thrombus.  Normal size of the pulmonary artery.  IMPRESSION: 1. Coronary calcium  score of 0. This was 0 percentile for age and sex matched control.  2. Normal coronary origin with right dominance.  3. No evidence of CAD.   Electronically Signed By: Kardie  Tobb MD On: 04/05/2019 20:14  Narrative EXAM: OVER-READ INTERPRETATION  CT CHEST  The following report is an over-read performed by radiologist Dr. Toribio Aye of [facility] on 04/05/2019. This over-read does not include interpretation of cardiac  or coronary anatomy or pathology. The coronary calcium  score/coronary CTA interpretation by the cardiologist is attached.  COMPARISON:  None.  FINDINGS: Within the visualized portions of the thorax there are no suspicious appearing pulmonary nodules or masses, there is no acute consolidative airspace disease, no pleural effusions, no pneumothorax and no lymphadenopathy. Visualized portions of the upper abdomen are unremarkable. There are no aggressive appearing lytic or blastic lesions noted in the visualized portions of the skeleton.  IMPRESSION: No significant incidental noncardiac findings are noted.  Electronically Signed: By: Toribio Aye M.D. On: 04/05/2019 16:24          Risk Assessment/Calculations:             Physical Exam:   VS:  BP 110/80 (BP Location: Right Arm, Patient Position: Sitting, Cuff Size: Large)   Pulse (!) 103   Ht 5' 5 (1.651 m)   Wt 240 lb (108.9 kg)   SpO2 98%   BMI 39.94 kg/m    Wt Readings from Last 3 Encounters:  05/15/23 240 lb (108.9 kg)  11/28/22 236 lb 3.2 oz (107.1 kg)  08/12/22 232 lb (105.2 kg)    GEN: Well nourished, well developed in no acute distress NECK: No JVD; No carotid bruits CARDIAC: RRR, no murmurs, rubs, gallops RESPIRATORY:  Clear to auscultation without rales, wheezing or rhonchi  ABDOMEN: Soft, non-tender, non-distended EXTREMITIES:  No edema; No deformity   ASSESSMENT AND PLAN: .     Chest Pain Atypical chest pain, reproducible with movement and palpation, worsened by deep breaths. Likely musculoskeletal in nature. No signs of cardiovascular chest pain. -Advise over-the-counter ibuprofen  for pain relief. -If pain persists after two weeks, consider chest x-ray.  Premature Ventricular Contractions (PVCs) Previously high PVC burden (25%) significantly reduced to 2.2% on repeat heart monitor after initiation of flecainide . -Continue flecainide  twice daily. -Order treadmill stress test due to flecainide   use, to be performed every 2-3 years.      Informed Consent   Shared Decision Making/Informed Consent The risks [chest pain, shortness of breath,  cardiac arrhythmias, dizziness, blood pressure fluctuations, myocardial infarction, stroke/transient ischemic attack, and life-threatening complications (estimated to be 1 in 10,000)], benefits (risk stratification, diagnosing coronary artery disease, treatment guidance) and alternatives of an exercise tolerance test were discussed in detail with Tabitha Hill and she agrees to proceed.     Dispo: Follow-up in 3 months  Signed, [place]ham Roughton, [place]

## 2023-05-15 NOTE — Patient Instructions (Signed)
 Medication Instructions:  Your physician recommends that you continue on your current medications as directed. Please refer to the Current Medication list given to you today.  *If you need a refill on your cardiac medications before your next appointment, please call your pharmacy*  Testing/Procedures: Your physician has requested that you have an Exercise Stress Test. An exercise tolerance test is a test to check how your heart works during exercise. You will need to walk on a treadmill for this test. An electrocardiogram (ECG) will record your heartbeat when you are at rest and when you are exercising.    Please arrive 15 minutes prior to your appointment time for registration and insurance purposes.   The test will take approximately 45 minutes to complete.   How to prepare for your Exercise Stress Test: Do bring a list of your current medications with you.  If not listed below, you may take your medications as normal. Do wear comfortable clothes (no dresses or overalls) and walking shoes, tennis shoes preferred (no heels or open toed shoes are allowed) Do Not wear cologne, perfume, aftershave or lotions (deodorant is allowed). Do not drink or eat foods with caffeine for 24 hours before the test. (Chocolate, coffee, tea, or energy drinks) If you use an inhaler, bring it with you to the test. Do not smoke for 4 hours before the test.    If these instructions are not followed, your test will have to be rescheduled.   If you cannot keep your appointment, please provide 24 hours notification to our office, to avoid a possible $50 charge to your account.      Follow-Up: At [facility], you and your health needs are our priority.  As part of our continuing mission to provide you with exceptional heart care, we have created designated Provider Care Teams.  These Care Teams include your primary Cardiologist (physician) and Advanced Practice Providers (APPs -  Physician Assistants  and Nurse Practitioners) who all work together to provide you with the care you need, when you need it.  Your next appointment:   3 month(s)  Provider:   Kardie Tobb, DO

## 2023-05-27 ENCOUNTER — Ambulatory Visit: Payer: BC Managed Care – PPO

## 2023-06-17 ENCOUNTER — Ambulatory Visit: Payer: BC Managed Care – PPO | Attending: Physician Assistant

## 2023-06-17 DIAGNOSIS — I493 Ventricular premature depolarization: Secondary | ICD-10-CM | POA: Diagnosis not present

## 2023-06-17 DIAGNOSIS — R0789 Other chest pain: Secondary | ICD-10-CM

## 2023-06-17 LAB — EXERCISE TOLERANCE TEST
Angina Index: 0
Duke Treadmill Score: 9
Estimated workload: 10.1
Exercise duration (min): 9 min
Exercise duration (sec): 0 s
MPHR: 172 {beats}/min
Peak HR: 166 {beats}/min
Percent HR: 96 %
RPE: 17
Rest HR: 93 {beats}/min
ST Depression (mm): 0 mm

## 2023-08-26 ENCOUNTER — Encounter: Payer: Self-pay | Admitting: Cardiology

## 2023-08-26 ENCOUNTER — Ambulatory Visit: Payer: BC Managed Care – PPO | Attending: Cardiology | Admitting: Cardiology

## 2023-08-26 VITALS — BP 120/90 | HR 87 | Ht 65.0 in | Wt 242.0 lb

## 2023-08-26 DIAGNOSIS — Z0000 Encounter for general adult medical examination without abnormal findings: Secondary | ICD-10-CM

## 2023-08-26 DIAGNOSIS — Z79899 Other long term (current) drug therapy: Secondary | ICD-10-CM

## 2023-08-26 DIAGNOSIS — E785 Hyperlipidemia, unspecified: Secondary | ICD-10-CM

## 2023-08-26 DIAGNOSIS — Z131 Encounter for screening for diabetes mellitus: Secondary | ICD-10-CM

## 2023-08-26 MED ORDER — FLECAINIDE ACETATE 50 MG PO TABS: 50.0000 mg | ORAL_TABLET | Freq: Two times a day (BID) | ORAL | 3 refills | Status: AC

## 2023-08-26 NOTE — Progress Notes (Signed)
 Cardiology Office Note:    Date:  08/26/2023   ID:  Tabitha Hill, DOB [birth year], MRN [id number]  PCP:  Patient, No Pcp Per  Cardiologist:  Tayo Maute, DO  Electrophysiologist:  None   Referring MD: Ervin Heath, PA   "I am doing well"  History of Present Illness:    Tabitha Hill is a 48 y.o. 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  I saw the patient in July 2024 at that time he is doing well from a cardiovascular standpoint.  Since I saw the patient she had been seen in our office by Ervin Heath, PA.  During her visit with health she reported chest discomfort.  Which was worsened by deep breath suspected to have been musculoskeletal in nature.  She recently had her exercise tolerance test on flecainide and this was within normal limits and is doing well. She is here today for follow-up visit.  She offers no complaints at this time.   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   pantoprazole (PROTONIX) 40 MG tablet Take 40 mg by mouth daily.     Allergies:   Codeine   Social History   Socioeconomic History   Marital status: Single    Spouse name: Not on file   Number of children: 1   Years of education: Not on file   Highest education level: Not on file  Occupational History   Not on file  Tobacco Use   Smoking status: Never   Smokeless tobacco: Never  Vaping Use   Vaping status: Never Used  Substance and Sexual Activity    Alcohol use: No   Drug use: No   Sexual activity: Not on file  Other Topics Concern   Not on file  Social History Narrative   Not on file   Social Drivers of Health   Financial Resource Strain: Low Risk  (02/19/2021)   Received from [facility], [facility]   Overall Financial Resource Strain (CARDIA)    Difficulty of Paying Living Expenses: Not very hard  Food Insecurity: No Food Insecurity (08/21/2021)   Received from [facility], [facility]   Hunger Vital Sign    Worried About Running Out of Food in the Last Year: Never true    Ran Out of Food in the Last Year: Never true  Transportation Needs: No Transportation Needs (02/19/2021)   Received from [employer], [facility]   PRAPARE - Transportation    Lack of Transportation (Medical): No    Lack of Transportation (Non-Medical): No  Physical Activity: Inactive (02/19/2021)   Received from [facility], [facility]   Exercise Vital Sign    Days of Exercise per Week: 0 days    Minutes of Exercise per Session: 0 min  Stress: No Stress Concern Present (02/19/2021)   Received from [facility] - Occupational  Stress Questionnaire    Feeling of Stress : Not at all  Social Connections: Unknown (09/21/2021)   Received from [facility], [facility]   Social Network    Social Network: Not on file     Family History: The patient's family history includes COPD in her father and paternal grandmother; Cancer in her father; Lung cancer in her paternal grandmother; Stomach cancer in her maternal grandmother.  ROS:   Review of Systems  Constitution: Negative for decreased appetite, fever and weight gain.  HENT: Negative for congestion, ear discharge, hoarse voice and sore throat.   Eyes: Negative for discharge, redness, vision loss in right eye and visual halos.  Cardiovascular: Negative for chest pain, dyspnea on exertion, leg swelling, orthopnea and  palpitations.  Respiratory: Negative for cough, hemoptysis, shortness of breath and snoring.   Endocrine: Negative for heat intolerance and polyphagia.  Hematologic/Lymphatic: Negative for bleeding problem. Does not bruise/bleed easily.  Skin: Negative for flushing, nail changes, rash and suspicious lesions.  Musculoskeletal: Negative for arthritis, joint pain, muscle cramps, myalgias, neck pain and stiffness.  Gastrointestinal: Negative for abdominal pain, bowel incontinence, diarrhea and excessive appetite.  Genitourinary: Negative for decreased libido, genital sores and incomplete emptying.  Neurological: Negative for brief paralysis, focal weakness, headaches and loss of balance.  Psychiatric/Behavioral: Negative for altered mental status, depression and suicidal ideas.  Allergic/Immunologic: Negative for HIV exposure and persistent infections.    EKGs/Labs/Other Studies Reviewed:    The following studies were reviewed today:   EKG:  The ekg ordered today demonstrates normal sinus rhythm, heart rate 83 bpm.  Exercise treadmill test  01/11/2020 Blood pressure demonstrated a normal response to exercise. There was no ST segment deviation noted during stress.   Normal ETT Normal hemodynamic response No significant arrhythmias  Zio monitor 09/22/2019 Indication:PVCs   Duration: 3d   Findings HR  avg 91  Min 58-Max 148  PVCs 2.2% PACs <1%   Recommendations Much improved with 10x reduction in PVCs 20>2%   Recent Labs: No results found for requested labs within last 365 days.  Recent Lipid Panel    Component Value Date/Time   CHOL 216 (H) 09/14/2021 0913   TRIG 85 09/14/2021 0913   HDL 50 09/14/2021 0913   CHOLHDL 4.3 09/14/2021 0913   LDLCALC 151 (H) 09/14/2021 0913    Physical Exam:    VS:  BP (!) 120/90 (BP Location: Right Arm, Patient Position: Sitting, Cuff Size: Normal)   Pulse 87   Ht 5\' 5"  (1.651 m)   Wt 242 lb (109.8 kg)   SpO2 94%   BMI 40.27 kg/m      Wt Readings from Last 3 Encounters:  08/26/23 242 lb (109.8 kg)  05/15/23 240 lb (108.9 kg)  11/28/22 236 lb 3.2 oz (107.1 kg)     GEN: Well nourished, well developed in no acute distress HEENT: Normal NECK: No JVD; No carotid bruits LYMPHATICS: No lymphadenopathy CARDIAC: S1S2 noted,RRR, no murmurs, rubs, gallops RESPIRATORY:  Clear to auscultation without rales, wheezing or rhonchi  ABDOMEN: Soft, non-tender, non-distended, +bowel sounds, no guarding. EXTREMITIES: No edema, No cyanosis, no clubbing MUSCULOSKELETAL:  No deformity  SKIN: Warm and dry NEUROLOGIC:  Alert and oriented x 3, non-focal PSYCHIATRIC:  Normal affect, good insight  ASSESSMENT:    No diagnosis found.    PLAN:    Frequent PVC-likely normal.  Had not been able to tolerate AV nodal blocker with this medicine.  Will continue to monitor closely.  Hyperlipidemia-she was on Crestor  and did not tolerate this medicine we will repeat her lipid profile  Diabetes screening.  The patient is in agreement with the above plan. The patient left the office in stable condition.  The patient will follow up in 16 weeks or sooner if needed.   Medication Adjustments/Labs and Tests Ordered: Current medicines are reviewed at length with the patient today.  Concerns regarding medicines are outlined above.  No orders of the defined types were placed in this encounter.  No orders of the defined types were placed in this encounter.   There are no Patient Instructions on file for this visit.   Adopting a Healthy Lifestyle.  Know what a healthy weight is for you (roughly BMI <25) and aim to maintain this   Aim for 7+ servings of fruits and vegetables daily   65-80+ fluid ounces of water or unsweet tea for healthy kidneys   Limit to max 1 drink of alcohol per day; avoid smoking/tobacco   Limit animal fats in diet for cholesterol and heart health - choose grass fed whenever available   Avoid highly processed foods,  and foods high in saturated/trans fats   Aim for low stress - take time to unwind and care for your mental health   Aim for 150 min of moderate intensity exercise weekly for heart health, and weights twice weekly for bone health   Aim for 7-9 hours of sleep daily   When it comes to diets, agreement about the perfect plan isnt easy to find, even among the experts. Experts at the [facility] [employer] developed an idea known as the Healthy Eating Plate. Just imagine a plate divided into logical, healthy portions.   The emphasis is on diet quality:   Load up on vegetables and fruits - one-half of your plate: Aim for color and variety, and remember that potatoes dont count.   Go for whole grains - one-quarter of your plate: Whole wheat, barley, wheat berries, quinoa, oats, brown rice, and foods made with them. If you want pasta, go with whole wheat pasta.   Protein power - one-quarter of your plate: Fish, chicken, beans, and nuts are all healthy, versatile protein sources. Limit red meat.   The diet, however, does go beyond the plate, offering a few other suggestions.   Use healthy plant oils, such as olive, canola, soy, corn, sunflower and peanut. Check the labels, and avoid partially hydrogenated oil, which have unhealthy trans fats.   If youre thirsty, drink water. Coffee and tea are good in moderation, but skip sugary drinks and limit milk and dairy products to one or two daily servings.   The type of carbohydrate in the diet is more important than the amount. Some sources of carbohydrates, such as vegetables, fruits, whole grains, and beans-are healthier than others.   Finally, stay active  Signed, Jerryl Morin, DO  08/26/2023 8:58 AM    [facility]

## 2023-08-26 NOTE — Patient Instructions (Addendum)
 Medication Instructions:  Your physician recommends that you continue on your current medications as directed. Please refer to the Current Medication list given to you today.  *If you need a refill on your cardiac medications before your next appointment, please call your pharmacy*  Lab Work: Lipids, Flecainide, HgbA1c If you have labs (blood work) drawn today and your tests are completely normal, you will receive your results only by: MyChart Message (if you have MyChart) OR A paper copy in the mail If you have any lab test that is abnormal or we need to change your treatment, we will call you to review the results.  Follow-Up: At [facility], you and your health needs are our priority.  As part of our continuing mission to provide you with exceptional heart care, our providers are all part of one team.  This team includes your primary Cardiologist (physician) and Advanced Practice Providers or APPs (Physician Assistants and Nurse Practitioners) who all work together to provide you with the care you need, when you need it.  Your next appointment:   1 year(s)  Provider:   Kardie Tobb, DO    Other Instructions       1st Floor: - Lobby - Registration  - Pharmacy  - Lab - Cafe  2nd Floor: - PV Lab - Diagnostic Testing (echo, CT, nuclear med)  3rd Floor: - Vacant  4th Floor: - TCTS (cardiothoracic surgery) - AFib Clinic - Structural Heart Clinic - Vascular Surgery  - Vascular Ultrasound  5th Floor: - HeartCare Cardiology (general and EP) - Clinical Pharmacy for coumadin, hypertension, lipid, weight-loss medications, and med management appointments    Valet parking services will be available as well.

## 2023-09-01 ENCOUNTER — Encounter: Payer: Self-pay | Admitting: Cardiology

## 2023-09-02 LAB — LIPID PANEL
Chol/HDL Ratio: 3.8 ratio (ref 0.0–4.4)
Cholesterol, Total: 207 mg/dL — ABNORMAL HIGH (ref 100–199)
HDL: 54 mg/dL (ref >39)
LDL Chol Calc (NIH): 141 mg/dL — ABNORMAL HIGH (ref 0–99)
Triglycerides: 69 mg/dL (ref 0–149)
VLDL Cholesterol Cal: 12 mg/dL (ref 5–40)

## 2023-09-02 LAB — FLECAINIDE LEVEL: Flecainide: 0.17 ug/mL — ABNORMAL LOW (ref 0.20–1.00)

## 2023-09-02 LAB — HEMOGLOBIN A1C
Est. average glucose Bld gHb Est-mCnc: 111 mg/dL
Hgb A1c MFr Bld: 5.5 % (ref 4.8–5.6)

## 2023-09-08 DIAGNOSIS — M25572 Pain in left ankle and joints of left foot: Secondary | ICD-10-CM | POA: Diagnosis not present

## 2023-11-18 DIAGNOSIS — E538 Deficiency of other specified B group vitamins: Secondary | ICD-10-CM | POA: Diagnosis not present

## 2023-11-18 DIAGNOSIS — G40209 Localization-related (focal) (partial) symptomatic epilepsy and epileptic syndromes with complex partial seizures, not intractable, without status epilepticus: Secondary | ICD-10-CM | POA: Diagnosis not present

## 2023-12-29 DIAGNOSIS — Z124 Encounter for screening for malignant neoplasm of cervix: Secondary | ICD-10-CM | POA: Diagnosis not present

## 2024-01-02 DIAGNOSIS — N926 Irregular menstruation, unspecified: Secondary | ICD-10-CM | POA: Diagnosis not present

## 2024-01-02 DIAGNOSIS — R1032 Left lower quadrant pain: Secondary | ICD-10-CM | POA: Diagnosis not present

## 2024-03-15 ENCOUNTER — Other Ambulatory Visit: Payer: Self-pay

## 2024-03-15 ENCOUNTER — Emergency Department (HOSPITAL_BASED_OUTPATIENT_CLINIC_OR_DEPARTMENT_OTHER)
Admission: EM | Admit: 2024-03-15 | Discharge: 2024-03-15 | Disposition: A | Discharge status: Home / Self Care | Attending: Emergency Medicine | Admitting: Emergency Medicine

## 2024-03-15 DIAGNOSIS — J029 Acute pharyngitis, unspecified: Secondary | ICD-10-CM | POA: Diagnosis present

## 2024-03-15 LAB — RESP PANEL BY RT-PCR (RSV, FLU A&B, COVID)  RVPGX2
Influenza A by PCR: NEGATIVE
Influenza B by PCR: NEGATIVE
Resp Syncytial Virus by PCR: NEGATIVE
SARS Coronavirus 2 by RT PCR: NEGATIVE

## 2024-03-15 MED ORDER — ALUM & MAG HYDROXIDE-SIMETH 200-200-20 MG/5ML PO SUSP
30.0000 mL | Freq: Once | ORAL | Status: AC
  Administered 2024-03-15: 30 mL via ORAL
  Filled 2024-03-15: qty 30

## 2024-03-15 MED ORDER — LIDOCAINE VISCOUS HCL 2 % MT SOLN
15.0000 mL | Freq: Once | OROMUCOSAL | Status: AC
  Administered 2024-03-15: 15 mL via OROMUCOSAL
  Filled 2024-03-15: qty 15

## 2024-03-15 NOTE — ED Provider Notes (Signed)
 [place] [facility] Provider Note   CSN: [id number] Arrival date & time: 03/15/24  9674     Patient presents with: Sore Throat   Tabitha Hill is a 48 y.o. female.   The history is provided by the patient.  Sore Throat This is a new problem. The current episode started 6 to 12 hours ago. The problem occurs constantly. The problem has not changed since onset.Pertinent negatives include no chest pain, no abdominal pain, no headaches and no shortness of breath. Nothing aggravates the symptoms. Nothing relieves the symptoms. She has tried nothing for the symptoms. The treatment provided no relief.  Patient had endoscopy for GERD on Friday and was fine post procedure.  Tonight went to [employer] and go a drink that had an unusual smell to it but tasted ok.  Since drinking it she now has a sore throat and feels like her face feels hot post drinking the drink.  States her hands still smell of what was on the cup.  No fevers, no voice change no difficulty with swallowing.       Prior to Admission medications   Medication Sig Start Date End Date Taking? Authorizing Provider  flecainide  (TAMBOCOR ) 50 MG tablet Take 1 tablet (50 mg total) by mouth 2 (two) times daily. 08/26/23   Tobb, Kardie, DO  lamoTRIgine (LAMICTAL) 150 MG tablet Take 150 mg by mouth 2 (two) times daily. 09/05/16   [provider]  pantoprazole (PROTONIX) 40 MG tablet Take 40 mg by mouth daily. 02/23/21   [provider]    Allergies: Codeine    Review of Systems  HENT:  Positive for sore throat. Negative for drooling, facial swelling, trouble swallowing and voice change.   Respiratory:  Negative for shortness of breath, wheezing and stridor.   Cardiovascular:  Negative for chest pain.  Gastrointestinal:  Negative for abdominal pain.  Neurological:  Negative for headaches.  All other systems reviewed and are negative.   Updated Vital Signs BP (!) 154/102 (BP  Location: Right Arm)   Pulse (!) 118   Temp 98.3 F (36.8 C) (Oral)   Resp 17   LMP 03/03/2024   SpO2 99%   Physical Exam Vitals and nursing note reviewed.  Constitutional:      General: She is not in acute distress.    Appearance: She is well-developed.  HENT:     Head: Normocephalic and atraumatic.     Mouth/Throat:     Mouth: Mucous membranes are moist.     Pharynx: Oropharynx is clear. No oropharyngeal exudate or posterior oropharyngeal erythema.  Eyes:     Pupils: Pupils are equal, round, and reactive to light.  Neck:     Comments: No pain with displacement of larynx.  Intact phonation.   Cardiovascular:     Rate and Rhythm: Normal rate and regular rhythm.     Pulses: Normal pulses.     Heart sounds: Normal heart sounds.  Pulmonary:     Effort: Pulmonary effort is normal. No respiratory distress.     Breath sounds: Normal breath sounds.  Abdominal:     General: Bowel sounds are normal. There is no distension.     Palpations: Abdomen is soft.     Tenderness: There is no abdominal tenderness. There is no guarding or rebound.  Musculoskeletal:        General: Normal range of motion.     Cervical back: Normal range of motion and neck supple. No rigidity or  tenderness.  Lymphadenopathy:     Cervical: No cervical adenopathy.  Skin:    General: Skin is warm and dry.     Capillary Refill: Capillary refill takes less than 2 seconds.     Findings: No erythema or rash.  Neurological:     General: No focal deficit present.     Mental Status: She is oriented to person, place, and time.     Deep Tendon Reflexes: Reflexes normal.  Psychiatric:        Mood and Affect: Mood normal.     (all labs ordered are listed, but only abnormal results are displayed) Labs Reviewed  RESP PANEL BY RT-PCR (RSV, FLU A&B, COVID)  [id number]    EKG: None  Radiology: No results found.   Procedures   Medications Ordered in the ED  alum & mag hydroxide-simeth (MAALOX/MYLANTA) 200-200-20  MG/5ML suspension 30 mL (has no administration in time range)  lidocaine (XYLOCAINE) 2 % viscous mouth solution 15 mL (has no administration in time range)                                    Medical Decision Making Patient with sudden onset sore throat post drinking a beverage from [employer] that smelled unusual   Amount and/or Complexity of Data Reviewed External Data Reviewed: notes.    Details: Previous notes reviewed  Labs: ordered.    Details: Negative covid flu and RSV  Risk OTC drugs. Prescription drug management. Risk Details: Based on the CENTOR criteria there is no indication for additional testing or treatment.  Exam is benign and reassuring, no redness, no swelling phonation is intact.  No fever.  Was able to take oral medications and is resting comfortably in bed.  Follow up with your PMD for ongoing care.       Final diagnoses:  None   No signs of systemic illness or infection. The patient is nontoxic-appearing on exam and vital signs are within normal limits.  I have reviewed the triage vital signs and the nursing notes. Pertinent labs & imaging results that were available during my care of the patient were reviewed by me and considered in my medical decision making (see chart for details). After history, exam, and medical workup I feel the patient has been appropriately medically screened and is safe for discharge home. Pertinent diagnoses were discussed with the patient. Patient was given return precautions.  ED Discharge Orders     None          Chandan Fly, MD 03/15/24 [id number]

## 2024-03-15 NOTE — ED Triage Notes (Signed)
 Had an endoscopy on Friday.  Went to [employer] and was given a drink that smells strongly of something like perfume.  Now has throat pain.
